# Patient Record
Sex: Female | Born: 1990 | Race: White | Hispanic: No | Smoking: Former smoker
Health system: Southern US, Community
[De-identification: ages and names within clinical notes are randomized; demographics above are authoritative.]

## PROBLEM LIST (undated history)

## (undated) ENCOUNTER — Inpatient Hospital Stay (HOSPITAL_COMMUNITY): Payer: Self-pay

## (undated) DIAGNOSIS — F32A Depression, unspecified: Secondary | ICD-10-CM

## (undated) DIAGNOSIS — F1920 Other psychoactive substance dependence, uncomplicated: Secondary | ICD-10-CM

## (undated) DIAGNOSIS — J45909 Unspecified asthma, uncomplicated: Secondary | ICD-10-CM

## (undated) DIAGNOSIS — K219 Gastro-esophageal reflux disease without esophagitis: Secondary | ICD-10-CM

## (undated) DIAGNOSIS — F329 Major depressive disorder, single episode, unspecified: Secondary | ICD-10-CM

## (undated) DIAGNOSIS — F419 Anxiety disorder, unspecified: Secondary | ICD-10-CM

## (undated) HISTORY — PX: OTHER SURGICAL HISTORY: SHX169

## (undated) HISTORY — PX: WISDOM TOOTH EXTRACTION: SHX21

---
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
## 2014-02-05 ENCOUNTER — Encounter (HOSPITAL_COMMUNITY): Payer: Self-pay

## 2014-02-05 ENCOUNTER — Emergency Department (HOSPITAL_COMMUNITY)
Admission: EM | Admit: 2014-02-05 | Discharge: 2014-02-05 | Disposition: A | Discharge status: Home / Self Care | Payer: 59 | Attending: Emergency Medicine | Admitting: Emergency Medicine

## 2014-02-05 DIAGNOSIS — F419 Anxiety disorder, unspecified: Secondary | ICD-10-CM | POA: Insufficient documentation

## 2014-02-05 DIAGNOSIS — R000 Tachycardia, unspecified: Secondary | ICD-10-CM | POA: Insufficient documentation

## 2014-02-05 DIAGNOSIS — F1410 Cocaine abuse, uncomplicated: Secondary | ICD-10-CM | POA: Insufficient documentation

## 2014-02-05 DIAGNOSIS — J45901 Unspecified asthma with (acute) exacerbation: Secondary | ICD-10-CM | POA: Diagnosis not present

## 2014-02-05 HISTORY — DX: Unspecified asthma, uncomplicated: J45.909

## 2014-02-05 LAB — CBC WITH DIFFERENTIAL/PLATELET
BASOS ABS: 0 10*3/uL (ref 0.0–0.1)
BASOS PCT: 0 % (ref 0–1)
EOS PCT: 1 % (ref 0–5)
Eosinophils Absolute: 0.1 10*3/uL (ref 0.0–0.7)
HEMATOCRIT: 39.2 % (ref 36.0–46.0)
HEMOGLOBIN: 12.9 g/dL (ref 12.0–15.0)
LYMPHS PCT: 40 % (ref 12–46)
Lymphs Abs: 3.6 10*3/uL (ref 0.7–4.0)
MCH: 26.6 pg (ref 26.0–34.0)
MCHC: 32.9 g/dL (ref 30.0–36.0)
MCV: 80.8 fL (ref 78.0–100.0)
MONOS PCT: 12 % (ref 3–12)
Monocytes Absolute: 1.1 10*3/uL — ABNORMAL HIGH (ref 0.1–1.0)
NEUTROS ABS: 4.1 10*3/uL (ref 1.7–7.7)
Neutrophils Relative %: 47 % (ref 43–77)
Platelets: 321 10*3/uL (ref 150–400)
RBC: 4.85 MIL/uL (ref 3.87–5.11)
RDW: 13.9 % (ref 11.5–15.5)
WBC: 8.9 10*3/uL (ref 4.0–10.5)

## 2014-02-05 LAB — BASIC METABOLIC PANEL
Anion gap: 20 — ABNORMAL HIGH (ref 5–15)
BUN: 11 mg/dL (ref 6–23)
CHLORIDE: 100 meq/L (ref 96–112)
CO2: 20 meq/L (ref 19–32)
Calcium: 9.4 mg/dL (ref 8.4–10.5)
Creatinine, Ser: 0.81 mg/dL (ref 0.50–1.10)
GFR calc non Af Amer: >90 mL/min (ref >90)
Glucose, Bld: 102 mg/dL — ABNORMAL HIGH (ref 70–99)
POTASSIUM: 3.1 meq/L — AB (ref 3.7–5.3)
Sodium: 140 mEq/L (ref 137–147)

## 2014-02-05 LAB — ETHANOL: Alcohol, Ethyl (B): <11 mg/dL (ref 0–11)

## 2014-02-05 LAB — I-STAT BETA HCG BLOOD, ED (MC, WL, AP ONLY)

## 2014-02-05 MED ORDER — POTASSIUM CHLORIDE CRYS ER 20 MEQ PO TBCR
40.0000 meq | EXTENDED_RELEASE_TABLET | Freq: Once | ORAL | Status: DC
  Filled 2014-02-05: qty 2

## 2014-02-05 MED ORDER — LORAZEPAM 2 MG/ML IJ SOLN
1.0000 mg | Freq: Once | INTRAMUSCULAR | Status: DC
  Filled 2014-02-05: qty 1

## 2014-02-05 MED ORDER — LORAZEPAM 2 MG/ML IJ SOLN
2.0000 mg | Freq: Once | INTRAMUSCULAR | Status: AC
  Administered 2014-02-05: 2 mg via INTRAVENOUS

## 2014-02-05 MED ORDER — LORAZEPAM 2 MG/ML IJ SOLN
INTRAMUSCULAR | Status: AC
  Filled 2014-02-05: qty 1

## 2014-02-05 NOTE — ED Notes (Signed)
Pt from home.  Sts she overdosed on cocaine IV.  Reports having trouble breathing shortly after doing drugs.  Pt tachycardic at 150 on arrival, O2 sats 100%.  Pt unsure of how much cocaine she took.

## 2014-02-05 NOTE — Discharge Instructions (Signed)
Stimulant Use Disorder-Cocaine °Cocaine is one of a group of powerful drugs called stimulants. Cocaine has medical uses for stopping nosebleeds and for pain control before minor nose or dental surgery. However, cocaine is misused because of the effects that it produces. These effects include:  °· A feeling of extreme pleasure. °· Alertness. °· High energy. °Common street names for cocaine include coke, crack, blow, snow, and nose candy. Cocaine is snorted, dissolved in water and injected, or smoked.  °Stimulants are addictive because they activate regions of the brain that produce both the pleasurable sensation of "reward" and psychological dependence. Together, these actions account for loss of control and the rapid development of drug dependence. This means you become ill without the drug (withdrawal) and need to keep using it to function.  °Stimulant use disorder is use of stimulants that disrupts your daily life. It disrupts relationships with family and friends and how you do your job. Cocaine increases your blood pressure and heart rate. It can cause a heart attack or stroke. Cocaine can also cause death from irregular heart rate or seizures. °SYMPTOMS °Symptoms of stimulant use disorder with cocaine include: °· Use of cocaine in larger amounts or over a longer period of time than intended. °· Unsuccessful attempts to cut down or control cocaine use. °· A lot of time spent obtaining, using, or recovering from the effects of cocaine. °· A strong desire or urge to use cocaine (craving). °· Continued use of cocaine in spite of major problems at work, school, or home because of use. °· Continued use of cocaine in spite of relationship problems because of use. °· Giving up or cutting down on important life activities because of cocaine use. °· Use of cocaine over and over in situations when it is physically hazardous, such as driving a car. °· Continued use of cocaine in spite of a physical problem that is likely  related to use. Physical problems can include: °¨ Malnutrition. °¨ Nosebleeds. °¨ Chest pain. °¨ High blood pressure. °¨ A hole that develops between the part of your nose that separates your nostrils (perforated nasal septum). °¨ Lung and kidney damage. °· Continued use of cocaine in spite of a mental problem that is likely related to use. Mental problems can include: °¨ Schizophrenia-like symptoms. °¨ Depression. °¨ Bipolar mood swings. °¨ Anxiety. °¨ Sleep problems. °· Need to use more and more cocaine to get the same effect, or lessened effect over time with use of the same amount of cocaine (tolerance). °· Having withdrawal symptoms when cocaine use is stopped, or using cocaine to reduce or avoid withdrawal symptoms. Withdrawal symptoms include: °¨ Depressed or irritable mood. °¨ Low energy or restlessness. °¨ Bad dreams. °¨ Poor or excessive sleep. °¨ Increased appetite. °DIAGNOSIS °Stimulant use disorder is diagnosed by your health care provider. You may be asked questions about your cocaine use and how it affects your life. A physical exam may be done. A drug screen may be ordered. You may be referred to a mental health professional. The diagnosis of stimulant use disorder requires at least two symptoms within 12 months. The type of stimulant use disorder depends on the number of signs and symptoms you have. The type may be: °· Mild. Two or three signs and symptoms. °· Moderate. Four or five signs and symptoms. °· Severe. Six or more signs and symptoms. °TREATMENT °Treatment for stimulant use disorder is usually provided by mental health professionals with training in substance use disorders. The following options are available: °·   Counseling or talk therapy. Talk therapy addresses the reasons you use cocaine and ways to keep you from using again. Goals of talk therapy include: °¨ Identifying and avoiding triggers for use. °¨ Handling cravings. °¨ Replacing use with healthy activities. °· Support groups.  Support groups provide emotional support, advice, and guidance. °· Medicine. Certain medicines may decrease cocaine cravings or withdrawal symptoms. °HOME CARE INSTRUCTIONS °· Take medicines only as directed by your health care provider. °· Identify the people and activities that trigger your cocaine use and avoid them. °· Keep all follow-up visits as directed by your health care provider. °SEEK MEDICAL CARE IF: °· Your symptoms get worse or you relapse. °· You are not able to take medicines as directed. °SEEK IMMEDIATE MEDICAL CARE IF: °· You have serious thoughts about hurting yourself or others. °· You have a seizure, chest pain, sudden weakness, or loss of speech or vision. °FOR MORE INFORMATION °· National Institute on Drug Abuse: www.drugabuse.gov °· Substance Abuse and Mental Health Services Administration: www.samhsa.gov °Document Released: 02/19/2000 Document Revised: 07/08/2013 Document Reviewed: 03/06/2013 °ExitCare® Patient Information ©2015 [employer]. This information is not intended to replace advice given to you by your health care provider. Make sure you discuss any questions you have with your health care provider. ° °

## 2014-02-05 NOTE — ED Provider Notes (Signed)
CSN: [id number]     Arrival date & time 02/05/14  0304 History  This chart was scribed for Toy BakerAnthony T Rhythm Gubbels, MD by Karle PlumberJennifer Tensley, ED Scribe. [facility] and the patient's care was started at 3:09 AM.  Chief Complaint  Patient presents with  . Drug Overdose   Patient is a 23 y.o. female presenting with Overdose. The history is provided by the patient. No language interpreter was used.  Drug Overdose Associated symptoms include shortness of breath.    HPI Comments:  Leah Whitaker is a 23 y.o. female with PMH of asthma who presents to the Emergency Department complaining of SOB secondary to using intravenous cocaine in her left arm PTA. She reports using approximately 1/4 gram and taking a Klonopin. She states she was trying to finish a paper for school and thought this would help her focus. She reports that she has done cocaine before from the same supplier in the past. Denies SI. Symptoms persistent and nothing makes them better. No prior history of same. Denies any other ingestions at this time.  Past Medical History  Diagnosis Date  . Asthma    History reviewed. No pertinent past surgical history. History reviewed. No pertinent family history. History  Substance Use Topics  . Smoking status: Unknown If Ever Smoked  . Smokeless tobacco: Not on file  . Alcohol Use: No   OB History    No data available     Review of Systems  Respiratory: Positive for shortness of breath.   Psychiatric/Behavioral: Negative for suicidal ideas. The patient is nervous/anxious.   All other systems reviewed and are negative.   Allergies  Review of patient's allergies indicates not on file.  Home Medications   Prior to Admission medications   Not on File   Triage Vitals: BP 136/90 mmHg  Pulse 147  Temp(Src) 98 F (36.7 C) (Oral)  Resp 14  Ht 5\' 8"  (1.727 m)  Wt 140 lb (63.504 kg)  BMI 21.29 kg/m2  SpO2 100% Physical Exam  Constitutional: She is oriented  to person, place, and time. She appears well-developed and well-nourished.  Non-toxic appearance. No distress.  HENT:  Head: Normocephalic and atraumatic.  Eyes: Conjunctivae, EOM and lids are normal. Pupils are equal, round, and reactive to light.  Neck: Normal range of motion. Neck supple. No tracheal deviation present. No thyroid mass present.  Cardiovascular: Regular rhythm and normal heart sounds.  Tachycardia present.  Exam reveals no gallop.   No murmur heard. Pulmonary/Chest: Effort normal and breath sounds normal. No stridor. No respiratory distress. She has no decreased breath sounds. She has no wheezes. She has no rhonchi. She has no rales.  Abdominal: Soft. Normal appearance and bowel sounds are normal. She exhibits no distension. There is no tenderness. There is no rebound and no CVA tenderness.  Musculoskeletal: Normal range of motion. She exhibits no edema or tenderness.  Neurological: She is alert and oriented to person, place, and time. She has normal strength. No cranial nerve deficit or sensory deficit. GCS eye subscore is 4. GCS verbal subscore is 5. GCS motor subscore is 6.  Skin: Skin is warm and dry. No abrasion and no rash noted.  Psychiatric: Her mood appears anxious. Her speech is rapid and/or pressured. She is agitated. She expresses no suicidal ideation. She expresses no suicidal plans.  Nursing note and vitals reviewed.   ED Course  Procedures (including critical care time) DIAGNOSTIC STUDIES: Oxygen Saturation is 100% on RA, normal  by my interpretation.   COORDINATION OF CARE: 3:12 AM- Will order blood work. Pt verbalizes understanding and agrees to plan.  Medications  LORazepam (ATIVAN) 2 MG/ML injection (not administered)    Labs Review Labs Reviewed  CBC WITH DIFFERENTIAL  BASIC METABOLIC PANEL    Imaging Review No results found.   EKG Interpretation   Date/Time:  Wednesday February 05 2014 03:06:22 EST Ventricular Rate:  150 PR Interval:   131 QRS Duration: 78 QT Interval:  363 QTC Calculation: 573 R Axis:   96 Text Interpretation:  Age not entered, assumed to be  23 years old for  purpose of ECG interpretation Sinus or ectopic atrial tachycardia  Borderline right axis deviation Minimal ST depression, diffuse leads  Prolonged QT interval Confirmed by Freida BusmanALLEN  MD, Averee Harb ([id number]) on 02/05/2014  3:15:05 AM      MDM   Final diagnoses:  None      I personally performed the services described in this documentation, which was scribed in my presence. The recorded information has been reviewed and is accurate.  4:49 AM Patient's heart rate rechecked and she is now resting comfortably with a heart rate of 108. She was given Ativan twice for her anxiety due to the cocaine use. She was again does not have any suicidal homicidal ideations. Patient states that she used to medications help her stay for test. She is stable for discharge  CRITICAL CARE Performed by: Toy BakerALLEN,Adrien Dietzman T Total critical care time: 50 Critical care time was exclusive of separately billable procedures and treating other patients. Critical care was necessary to treat or prevent imminent or life-threatening deterioration. Critical care was time spent personally by me on the following activities: development of treatment plan with patient and/or surrogate as well as nursing, discussions with consultants, evaluation of patient's response to treatment, examination of patient, obtaining history from patient or surrogate, ordering and performing treatments and interventions, ordering and review of laboratory studies, ordering and review of radiographic studies, pulse oximetry and re-evaluation of patient's condition.   Toy BakerAnthony T Oneita Allmon, MD 02/05/14 [phone]

## 2014-06-24 LAB — OB RESULTS CONSOLE HEPATITIS B SURFACE ANTIGEN: HEP B S AG: NEGATIVE

## 2014-06-24 LAB — OB RESULTS CONSOLE RPR: RPR: NONREACTIVE

## 2014-06-24 LAB — OB RESULTS CONSOLE ABO/RH: RH Type: POSITIVE

## 2014-06-24 LAB — OB RESULTS CONSOLE RUBELLA ANTIBODY, IGM: RUBELLA: IMMUNE

## 2014-06-24 LAB — OB RESULTS CONSOLE HIV ANTIBODY (ROUTINE TESTING): HIV: NONREACTIVE

## 2014-06-24 LAB — OB RESULTS CONSOLE ANTIBODY SCREEN: ANTIBODY SCREEN: NEGATIVE

## 2014-07-09 LAB — OB RESULTS CONSOLE GC/CHLAMYDIA
Chlamydia: NEGATIVE
GC PROBE AMP, GENITAL: NEGATIVE

## 2014-10-15 ENCOUNTER — Encounter (HOSPITAL_COMMUNITY): Payer: Self-pay | Admitting: *Deleted

## 2014-10-15 ENCOUNTER — Inpatient Hospital Stay (HOSPITAL_COMMUNITY)
Admission: AD | Admit: 2014-10-15 | Discharge: 2014-10-15 | Disposition: A | Discharge status: Home / Self Care | Payer: 59 | Source: Ambulatory Visit | Attending: Obstetrics and Gynecology | Admitting: Obstetrics and Gynecology

## 2014-10-15 DIAGNOSIS — Z3A32 32 weeks gestation of pregnancy: Secondary | ICD-10-CM | POA: Insufficient documentation

## 2014-10-15 DIAGNOSIS — Z87891 Personal history of nicotine dependence: Secondary | ICD-10-CM | POA: Insufficient documentation

## 2014-10-15 LAB — URINALYSIS, ROUTINE W REFLEX MICROSCOPIC
Bilirubin Urine: NEGATIVE
Glucose, UA: NEGATIVE mg/dL
Hgb urine dipstick: NEGATIVE
Ketones, ur: NEGATIVE mg/dL
Leukocytes, UA: NEGATIVE
NITRITE: NEGATIVE
PROTEIN: NEGATIVE mg/dL
UROBILINOGEN UA: 0.2 mg/dL (ref 0.0–1.0)
pH: 5.5 (ref 5.0–8.0)

## 2014-10-15 MED ORDER — NIFEDIPINE 10 MG PO CAPS: 10.0000 mg | ORAL_CAPSULE | Freq: Three times a day (TID) | ORAL | Status: DC

## 2014-10-15 MED ORDER — NIFEDIPINE 10 MG PO CAPS
10.0000 mg | ORAL_CAPSULE | Freq: Once | ORAL | Status: AC
  Administered 2014-10-15: 10 mg via ORAL
  Filled 2014-10-15: qty 1

## 2014-10-15 MED ORDER — BETAMETHASONE SOD PHOS & ACET 6 (3-3) MG/ML IJ SUSP
12.0000 mg | Freq: Once | INTRAMUSCULAR | Status: AC
  Administered 2014-10-15: 12 mg via INTRAMUSCULAR
  Filled 2014-10-15: qty 2

## 2014-10-15 NOTE — MAU Note (Signed)
Given a second pitcher of water and enc'd to drink.

## 2014-10-15 NOTE — Discharge Instructions (Signed)
Preterm Labor Information °Preterm labor is when labor starts at less than 37 weeks of pregnancy. The normal length of a pregnancy is 39 to 41 weeks. °CAUSES °Often, there is no identifiable underlying cause as to why a woman goes into preterm labor. One of the most common known causes of preterm labor is infection. Infections of the uterus, cervix, vagina, amniotic sac, bladder, kidney, or even the lungs (pneumonia) can cause labor to start. Other suspected causes of preterm labor include:  °· Urogenital infections, such as yeast infections and bacterial vaginosis.   °· Uterine abnormalities (uterine shape, uterine septum, fibroids, or bleeding from the placenta).   °· A cervix that has been operated on (it may fail to stay closed).   °· Malformations in the fetus.   °· Multiple gestations (twins, triplets, and so on).   °· Breakage of the amniotic sac.   °RISK FACTORS °· Having a previous history of preterm labor.   °· Having premature rupture of membranes (PROM).   °· Having a placenta that covers the opening of the cervix (placenta previa).   °· Having a placenta that separates from the uterus (placental abruption).   °· Having a cervix that is too weak to hold the fetus in the uterus (incompetent cervix).   °· Having too much fluid in the amniotic sac (polyhydramnios).   °· Taking illegal drugs or smoking while pregnant.   °· Not gaining enough weight while pregnant.   °· Being younger than 18 and older than 22 years old.   °· Having a low socioeconomic status.   °· Being African American. °SYMPTOMS °Signs and symptoms of preterm labor include:  °· Menstrual-like cramps, abdominal pain, or back pain. °· Uterine contractions that are regular, as frequent as six in an hour, regardless of their intensity (may be mild or painful). °· Contractions that start on the top of the uterus and spread down to the lower abdomen and back.   °· A sense of increased pelvic pressure.   °· A watery or bloody mucus discharge that  comes from the vagina.   °TREATMENT °Depending on the length of the pregnancy and other circumstances, your health care provider may suggest bed rest. If necessary, there are medicines that can be given to stop contractions and to mature the fetal lungs. If labor happens before 34 weeks of pregnancy, a prolonged hospital stay may be recommended. Treatment depends on the condition of both you and the fetus.  °WHAT SHOULD YOU DO IF YOU THINK YOU ARE IN PRETERM LABOR? °Call your health care provider right away. You will need to go to the hospital to get checked immediately. °HOW CAN YOU PREVENT PRETERM LABOR IN FUTURE PREGNANCIES? °You should:  °· Stop smoking if you smoke.  °· Maintain healthy weight gain and avoid chemicals and drugs that are not necessary. °· Be watchful for any type of infection. °· Inform your health care provider if you have a known history of preterm labor. °Document Released: 05/14/2003 Document Revised: 10/24/2012 Document Reviewed: 03/26/2012 °ExitCare® Patient Information ©2015 [employer]. This information is not intended to replace advice given to you by your health care provider. Make sure you discuss any questions you have with your health care provider. ° °Pelvic Rest °Pelvic rest is sometimes recommended for women when:  °· The placenta is partially or completely covering the opening of the cervix (placenta previa). °· There is bleeding between the uterine wall and the amniotic sac in the first trimester (subchorionic hemorrhage). °· The cervix begins to open without labor starting (incompetent cervix, cervical insufficiency). °· The labor is too early (preterm   labor). °HOME CARE INSTRUCTIONS °· Do not have sexual intercourse, stimulation, or an orgasm. °· Do not use tampons, douche, or put anything in the vagina. °· Do not lift anything over 10 pounds (4.5 kg). °· Avoid strenuous activity or straining your pelvic muscles. °SEEK MEDICAL CARE IF:  °· You have any vaginal bleeding  during pregnancy. Treat this as a potential emergency. °· You have cramping pain felt low in the stomach (stronger than menstrual cramps). °· You notice vaginal discharge (watery, mucus, or bloody). °· You have a low, dull backache. °· There are regular contractions or uterine tightening. °SEEK IMMEDIATE MEDICAL CARE IF: °You have vaginal bleeding and have placenta previa.  °Document Released: 06/18/2010 Document Revised: 05/16/2011 Document Reviewed: 06/18/2010 °ExitCare® Patient Information ©2015 [employer]. This information is not intended to replace advice given to you by your health care provider. Make sure you discuss any questions you have with your health care provider. ° °

## 2014-10-15 NOTE — MAU Note (Signed)
Pt sent from office for contractions. Is dilated to 2cm/50. C/o mild "periodlike" cramping.

## 2014-10-15 NOTE — MAU Note (Signed)
Given a pitcher of water and enc'd to drink. 

## 2014-10-15 NOTE — MAU Provider Note (Signed)
History     CSN: [id number]  Arrival date and time: 10/15/14 1552   First Provider Initiated Contact with Patient 10/15/14 1725      Chief Complaint  Patient presents with  . Contractions   HPI Comments: Leah Whitaker is a 24 y.o. G1P0 at [redacted]w[redacted]d who had routine office visit today. She's been feeling lower abdominal menstrual-like cramps for about 2 weeks intermittently. Denies feeling cramps or abdominal discomfort now. She has had an increase in amount of vaginal discharge for about 6 weeks. She describes it as thin white and non-irritative. Denies leaking of fluid. Had bloody discharge after vaginal exam today. Good fetal activity. She had office monitoring revealing 2-3 minute contractions and cervix 2/50 on vaginal exam. Nitrazine and fern tests were neg.  Prenatal course essentially uncomplicated.    GYN hx: Denies STIs, abnl Pap   Past Medical History  Diagnosis Date  . Asthma     Past Surgical History  Procedure Laterality Date  . Wisdom tooth extraction      History reviewed. No pertinent family history.  Social History  Substance Use Topics  . Smoking status: Former Games developer  . Smokeless tobacco: None  . Alcohol Use: No    Allergies: No Known Allergies  Prescriptions prior to admission  Medication Sig Dispense Refill Last Dose  . acetaminophen (TYLENOL) 500 MG tablet Take 500-1,000 mg by mouth every 6 (six) hours as needed for moderate pain or headache.   Past Week at Unknown time  . albuterol (PROVENTIL HFA;VENTOLIN HFA) 108 (90 BASE) MCG/ACT inhaler Inhale 2 puffs into the lungs every 4 (four) hours as needed for wheezing or shortness of breath.    rescue  . beclomethasone (QVAR) 40 MCG/ACT inhaler Inhale 1 puff into the lungs 2 (two) times daily.   rescue  . Prenatal Vit-Fe Fumarate-FA (PRENATAL MULTIVITAMIN) TABS tablet Take 1 tablet by mouth daily at 12 noon.   10/14/2014 at Unknown time  . ranitidine (ZANTAC) 75 MG tablet Take 75 mg by mouth daily as  needed for heartburn.   10/14/2014 at Unknown time    Review of Systems  Constitutional: Negative for fever.  Gastrointestinal: Positive for heartburn. Negative for nausea, abdominal pain, diarrhea and constipation.  Genitourinary: Negative for dysuria, urgency, frequency, hematuria and flank pain.  Neurological: Negative for dizziness and headaches.  Psychiatric/Behavioral: Negative for depression.   Physical Exam   Blood pressure 106/62, pulse 72, temperature 99.1 F (37.3 C), temperature source Oral, resp. rate 18, height 5\' 8"  (1.727 m), weight 69.219 kg (152 lb 9.6 oz).  Physical Exam  Constitutional: She is oriented to person, place, and time. She appears well-developed and well-nourished. No distress.  HENT:  Head: Normocephalic.  Eyes: Conjunctivae are normal. Pupils are equal, round, and reactive to light.  Neck: Normal range of motion. Neck supple.  Cardiovascular: Normal rate.   Respiratory: Effort normal.  GI: Soft. There is no tenderness.  S=D  Musculoskeletal: Normal range of motion.  Neurological: She is alert and oriented to person, place, and time.  Skin: Skin is warm and dry.  Psychiatric: She has a normal mood and affect. Her behavior is normal.   1830: SVE: posterior, soft 1.5/30/-2 cephalic  MAU Course  Procedures Results for orders placed or performed during the hospital encounter of 10/15/14 (from the past 24 hour(s))  Urinalysis, Routine w reflex microscopic (not at [facility])     Status: Abnormal   Collection Time: 10/15/14  4:15 PM  Result Value Ref Range  Color, Urine YELLOW YELLOW   APPearance CLEAR CLEAR   Specific Gravity, Urine <1.005 (L) 1.005 - 1.030   pH 5.5 5.0 - 8.0   Glucose, UA NEGATIVE NEGATIVE mg/dL   Hgb urine dipstick NEGATIVE NEGATIVE   Bilirubin Urine NEGATIVE NEGATIVE   Ketones, ur NEGATIVE NEGATIVE mg/dL   Protein, ur NEGATIVE NEGATIVE mg/dL   Urobilinogen, UA 0.2 0.0 - 1.0 mg/dL   Nitrite NEGATIVE NEGATIVE   Leukocytes, UA  NEGATIVE NEGATIVE   EFM: Baseline 120-125,reactive; low amplitude UCs q3-5 minutes 40-50 seconds (pt unaware) Procardia  po given at 1745  C/W Dr. Marcelle Overlie. Advised inpatient management but pt declines.  Betamethasone 12.mg IM given at 1900  Assessment and Plan  G1 at [redacted]w[redacted]d 1. Preterm labor in third trimester without delivery    Discharge home on Procardia, pelvic rest Work excuse for 2 days   Medication List    TAKE these medications        acetaminophen 500 MG tablet  Commonly known as:  TYLENOL  Take 500-1,000 mg by mouth every 6 (six) hours as needed for moderate pain or headache.     albuterol 108 (90 BASE) MCG/ACT inhaler  Commonly known as:  PROVENTIL HFA;VENTOLIN HFA  Inhale 2 puffs into the lungs every 4 (four) hours as needed for wheezing or shortness of breath.     beclomethasone 40 MCG/ACT inhaler  Commonly known as:  QVAR  Inhale 1 puff into the lungs 2 (two) times daily.     NIFEdipine 10 MG capsule  Commonly known as:  PROCARDIA  Take 1 capsule (10 mg total) by mouth every 8 (eight) hours.     prenatal multivitamin Tabs tablet  Take 1 tablet by mouth daily at 12 noon.     ranitidine 75 MG tablet  Commonly known as:  ZANTAC  Take 75 mg by mouth daily as needed for heartburn.        Follow-up Information    Follow up with Meriel Pica, MD. Schedule an appointment as soon as possible for a visit in 2 days.   Specialty:  Obstetrics and Gynecology   Contact information:   [address] [place] [address] [phone]       Follow up with [facility] [place] MATERNITY ADMISSIONS In 1 day.   Contact information:   [address] [id number] mc [place] [address] [phone]     Eden Toohey 10/15/2014, 5:33 PM

## 2014-10-16 ENCOUNTER — Inpatient Hospital Stay (HOSPITAL_COMMUNITY)
Admission: AD | Admit: 2014-10-16 | Discharge: 2014-10-16 | Disposition: A | Discharge status: Home / Self Care | Payer: 59 | Source: Ambulatory Visit | Attending: Obstetrics and Gynecology | Admitting: Obstetrics and Gynecology

## 2014-10-16 DIAGNOSIS — Z3A32 32 weeks gestation of pregnancy: Secondary | ICD-10-CM | POA: Diagnosis not present

## 2014-10-16 MED ORDER — BETAMETHASONE SOD PHOS & ACET 6 (3-3) MG/ML IJ SUSP
12.0000 mg | Freq: Once | INTRAMUSCULAR | Status: AC
  Administered 2014-10-16: 12 mg via INTRAMUSCULAR
  Filled 2014-10-16: qty 2

## 2014-10-16 NOTE — MAU Note (Signed)
Pt states here for 2nd BMZ injection. Denies any changes with contractions. Has some discharge, but states that she has had it for a few weeks now and it is consistently the same as before. +FM

## 2014-11-07 LAB — OB RESULTS CONSOLE GBS: STREP GROUP B AG: NEGATIVE

## 2014-11-21 ENCOUNTER — Encounter (HOSPITAL_COMMUNITY): Payer: Self-pay | Admitting: *Deleted

## 2014-11-21 ENCOUNTER — Inpatient Hospital Stay (HOSPITAL_COMMUNITY)
Admission: AD | Admit: 2014-11-21 | Discharge: 2014-11-21 | Disposition: A | Discharge status: Home / Self Care | Payer: 59 | Source: Ambulatory Visit | Attending: Obstetrics and Gynecology | Admitting: Obstetrics and Gynecology

## 2014-11-21 DIAGNOSIS — Z3493 Encounter for supervision of normal pregnancy, unspecified, third trimester: Secondary | ICD-10-CM | POA: Insufficient documentation

## 2014-11-21 MED ORDER — ZOLPIDEM TARTRATE 5 MG PO TABS
5.0000 mg | ORAL_TABLET | Freq: Once | ORAL | Status: AC
  Administered 2014-11-21: 5 mg via ORAL
  Filled 2014-11-21: qty 1

## 2014-11-21 NOTE — MAU Note (Signed)
Irreg ctxs since Tues. Weds was 4cm. Closer today. Denies bleeding or LOF.

## 2014-11-21 NOTE — Progress Notes (Signed)
Note made in [facility]'s record

## 2014-11-21 NOTE — Progress Notes (Signed)
Strip monitored on incorrect patient starting at 2104.  Strip printed and sent to medical records

## 2014-11-28 ENCOUNTER — Inpatient Hospital Stay (HOSPITAL_COMMUNITY): Payer: 59 | Admitting: Anesthesiology

## 2014-11-28 ENCOUNTER — Encounter (HOSPITAL_COMMUNITY): Payer: Self-pay

## 2014-11-28 ENCOUNTER — Inpatient Hospital Stay (HOSPITAL_COMMUNITY)
Admission: AD | Admit: 2014-11-28 | Discharge: 2014-11-30 | DRG: 775 | Disposition: A | Discharge status: Home / Self Care | Payer: 59 | Source: Ambulatory Visit | Attending: Obstetrics and Gynecology | Admitting: Obstetrics and Gynecology

## 2014-11-28 DIAGNOSIS — J45909 Unspecified asthma, uncomplicated: Secondary | ICD-10-CM | POA: Diagnosis present

## 2014-11-28 DIAGNOSIS — O99324 Drug use complicating childbirth: Secondary | ICD-10-CM | POA: Diagnosis present

## 2014-11-28 DIAGNOSIS — O9952 Diseases of the respiratory system complicating childbirth: Secondary | ICD-10-CM | POA: Diagnosis present

## 2014-11-28 DIAGNOSIS — Z3A38 38 weeks gestation of pregnancy: Secondary | ICD-10-CM | POA: Diagnosis present

## 2014-11-28 DIAGNOSIS — Z87891 Personal history of nicotine dependence: Secondary | ICD-10-CM

## 2014-11-28 DIAGNOSIS — F1490 Cocaine use, unspecified, uncomplicated: Secondary | ICD-10-CM | POA: Diagnosis present

## 2014-11-28 LAB — CBC
HEMATOCRIT: 34.1 % — AB (ref 36.0–46.0)
HEMOGLOBIN: 11.3 g/dL — AB (ref 12.0–15.0)
MCH: 25.9 pg — ABNORMAL LOW (ref 26.0–34.0)
MCHC: 33.1 g/dL (ref 30.0–36.0)
MCV: 78 fL (ref 78.0–100.0)
Platelets: 198 10*3/uL (ref 150–400)
RBC: 4.37 MIL/uL (ref 3.87–5.11)
RDW: 14 % (ref 11.5–15.5)
WBC: 12.3 10*3/uL — AB (ref 4.0–10.5)

## 2014-11-28 LAB — RAPID URINE DRUG SCREEN, HOSP PERFORMED
Amphetamines: NOT DETECTED
Barbiturates: NOT DETECTED
Benzodiazepines: NOT DETECTED
Cocaine: NOT DETECTED
OPIATES: NOT DETECTED
Tetrahydrocannabinol: NOT DETECTED

## 2014-11-28 LAB — TYPE AND SCREEN
ABO/RH(D): A POS
ANTIBODY SCREEN: NEGATIVE

## 2014-11-28 LAB — ABO/RH: ABO/RH(D): A POS

## 2014-11-28 LAB — RPR: RPR: NONREACTIVE

## 2014-11-28 MED ORDER — LACTATED RINGERS IV SOLN
INTRAVENOUS | Status: DC
  Administered 2014-11-28: 125 mL/h via INTRAVENOUS

## 2014-11-28 MED ORDER — ALBUTEROL SULFATE (2.5 MG/3ML) 0.083% IN NEBU: 3.0000 mL | INHALATION_SOLUTION | RESPIRATORY_TRACT | Status: DC | PRN

## 2014-11-28 MED ORDER — OXYTOCIN BOLUS FROM INFUSION: 500.0000 mL | INTRAVENOUS | Status: DC

## 2014-11-28 MED ORDER — FLEET ENEMA 7-19 GM/118ML RE ENEM: 1.0000 | ENEMA | RECTAL | Status: DC | PRN

## 2014-11-28 MED ORDER — FENTANYL 2.5 MCG/ML BUPIVACAINE 1/10 % EPIDURAL INFUSION (WH - ANES)
14.0000 mL/h | INTRAMUSCULAR | Status: DC | PRN
  Administered 2014-11-28 (×2): 14 mL/h via EPIDURAL
  Filled 2014-11-28 (×2): qty 125

## 2014-11-28 MED ORDER — BUTORPHANOL TARTRATE 1 MG/ML IJ SOLN
2.0000 mg | Freq: Once | INTRAMUSCULAR | Status: AC
  Administered 2014-11-28: 2 mg via INTRAVENOUS
  Filled 2014-11-28: qty 2

## 2014-11-28 MED ORDER — SENNOSIDES-DOCUSATE SODIUM 8.6-50 MG PO TABS
2.0000 | ORAL_TABLET | ORAL | Status: DC
  Administered 2014-11-28: 2 via ORAL
  Filled 2014-11-28 (×2): qty 2

## 2014-11-28 MED ORDER — PRENATAL MULTIVITAMIN CH
1.0000 | ORAL_TABLET | Freq: Every day | ORAL | Status: DC
  Administered 2014-11-29 – 2014-11-30 (×2): 1 via ORAL
  Filled 2014-11-28 (×2): qty 1

## 2014-11-28 MED ORDER — LACTATED RINGERS IV SOLN: 500.0000 mL | INTRAVENOUS | Status: DC | PRN

## 2014-11-28 MED ORDER — ONDANSETRON HCL 4 MG/2ML IJ SOLN: 4.0000 mg | Freq: Four times a day (QID) | INTRAMUSCULAR | Status: DC | PRN

## 2014-11-28 MED ORDER — WITCH HAZEL-GLYCERIN EX PADS: 1.0000 "application " | MEDICATED_PAD | CUTANEOUS | Status: DC | PRN

## 2014-11-28 MED ORDER — IBUPROFEN 600 MG PO TABS
600.0000 mg | ORAL_TABLET | Freq: Four times a day (QID) | ORAL | Status: DC
  Administered 2014-11-28 – 2014-11-30 (×8): 600 mg via ORAL
  Filled 2014-11-28 (×8): qty 1

## 2014-11-28 MED ORDER — EPHEDRINE 5 MG/ML INJ
10.0000 mg | INTRAVENOUS | Status: DC | PRN
Start: 2014-11-28 — End: 2014-11-28
  Filled 2014-11-28: qty 2

## 2014-11-28 MED ORDER — PHENYLEPHRINE 40 MCG/ML (10ML) SYRINGE FOR IV PUSH (FOR BLOOD PRESSURE SUPPORT)
80.0000 ug | PREFILLED_SYRINGE | INTRAVENOUS | Status: DC | PRN
  Filled 2014-11-28: qty 20
  Filled 2014-11-28: qty 2

## 2014-11-28 MED ORDER — BENZOCAINE-MENTHOL 20-0.5 % EX AERO
1.0000 "application " | INHALATION_SPRAY | CUTANEOUS | Status: DC | PRN
  Filled 2014-11-28: qty 56

## 2014-11-28 MED ORDER — OXYCODONE-ACETAMINOPHEN 5-325 MG PO TABS: 2.0000 | ORAL_TABLET | ORAL | Status: DC | PRN

## 2014-11-28 MED ORDER — ACETAMINOPHEN 325 MG PO TABS: 650.0000 mg | ORAL_TABLET | ORAL | Status: DC | PRN

## 2014-11-28 MED ORDER — ONDANSETRON HCL 4 MG PO TABS: 4.0000 mg | ORAL_TABLET | ORAL | Status: DC | PRN

## 2014-11-28 MED ORDER — DIBUCAINE 1 % RE OINT: 1.0000 "application " | TOPICAL_OINTMENT | RECTAL | Status: DC | PRN

## 2014-11-28 MED ORDER — PROMETHAZINE HCL 25 MG/ML IJ SOLN
12.5000 mg | Freq: Once | INTRAMUSCULAR | Status: AC
  Administered 2014-11-28: 12.5 mg via INTRAVENOUS
  Filled 2014-11-28: qty 1

## 2014-11-28 MED ORDER — LANOLIN HYDROUS EX OINT: TOPICAL_OINTMENT | CUTANEOUS | Status: DC | PRN

## 2014-11-28 MED ORDER — ONDANSETRON HCL 4 MG/2ML IJ SOLN: 4.0000 mg | INTRAMUSCULAR | Status: DC | PRN

## 2014-11-28 MED ORDER — DIPHENHYDRAMINE HCL 50 MG/ML IJ SOLN: 12.5000 mg | INTRAMUSCULAR | Status: DC | PRN

## 2014-11-28 MED ORDER — LIDOCAINE HCL (PF) 1 % IJ SOLN
INTRAMUSCULAR | Status: DC | PRN
  Administered 2014-11-28: 8 mL via EPIDURAL
  Administered 2014-11-28: 7 mL via EPIDURAL

## 2014-11-28 MED ORDER — CITRIC ACID-SODIUM CITRATE 334-500 MG/5ML PO SOLN
30.0000 mL | ORAL | Status: DC | PRN
  Administered 2014-11-28: 30 mL via ORAL
  Filled 2014-11-28: qty 15

## 2014-11-28 MED ORDER — OXYCODONE-ACETAMINOPHEN 5-325 MG PO TABS
1.0000 | ORAL_TABLET | ORAL | Status: DC | PRN
  Administered 2014-11-28 – 2014-11-29 (×3): 1 via ORAL
  Filled 2014-11-28 (×3): qty 1

## 2014-11-28 MED ORDER — OXYCODONE-ACETAMINOPHEN 5-325 MG PO TABS
1.0000 | ORAL_TABLET | ORAL | Status: DC | PRN
  Administered 2014-11-28: 1 via ORAL
  Filled 2014-11-28: qty 1

## 2014-11-28 MED ORDER — OXYTOCIN 40 UNITS IN LACTATED RINGERS INFUSION - SIMPLE MED
62.5000 mL/h | INTRAVENOUS | Status: DC
  Administered 2014-11-28: 62.5 mL/h via INTRAVENOUS
  Filled 2014-11-28: qty 1000

## 2014-11-28 MED ORDER — SIMETHICONE 80 MG PO CHEW: 80.0000 mg | CHEWABLE_TABLET | ORAL | Status: DC | PRN

## 2014-11-28 MED ORDER — ZOLPIDEM TARTRATE 5 MG PO TABS: 5.0000 mg | ORAL_TABLET | Freq: Every evening | ORAL | Status: DC | PRN

## 2014-11-28 MED ORDER — TETANUS-DIPHTH-ACELL PERTUSSIS 5-2.5-18.5 LF-MCG/0.5 IM SUSP: 0.5000 mL | Freq: Once | INTRAMUSCULAR | Status: DC

## 2014-11-28 MED ORDER — LIDOCAINE HCL (PF) 1 % IJ SOLN
30.0000 mL | INTRAMUSCULAR | Status: DC | PRN
  Filled 2014-11-28: qty 30

## 2014-11-28 MED ORDER — ACETAMINOPHEN 325 MG PO TABS
650.0000 mg | ORAL_TABLET | ORAL | Status: DC | PRN
  Administered 2014-11-29 – 2014-11-30 (×5): 650 mg via ORAL
  Filled 2014-11-28 (×5): qty 2

## 2014-11-28 MED ORDER — DIPHENHYDRAMINE HCL 25 MG PO CAPS: 25.0000 mg | ORAL_CAPSULE | Freq: Four times a day (QID) | ORAL | Status: DC | PRN

## 2014-11-28 NOTE — MAU Note (Signed)
Pt states contractions every 2 mins. Denies LOF or vag bleeding. Was 4cm in office today.

## 2014-11-28 NOTE — Progress Notes (Signed)
Patient mood instability has been observed by this nurse in patient responses to people and events within minutes.  Patient stated to patient's father she was too upset and in too much pain to answer a question, but within one minute was textmessaging on phone smiling and talking to another person in the room.

## 2014-11-28 NOTE — Anesthesia Preprocedure Evaluation (Signed)
Anesthesia Evaluation  Patient identified by MRN, date of birth, ID band Patient awake    Reviewed: Allergy & Precautions, H&P , NPO status , Patient's Chart, lab work & pertinent test results  Airway Mallampati: I  TM Distance: >3 FB Neck ROM: full    Dental no notable dental hx.    Pulmonary former smoker,    Pulmonary exam normal        Cardiovascular negative cardio ROS Normal cardiovascular exam     Neuro/Psych negative neurological ROS  negative psych ROS   GI/Hepatic negative GI ROS, Neg liver ROS,   Endo/Other  negative endocrine ROS  Renal/GU negative Renal ROS     Musculoskeletal   Abdominal Normal abdominal exam  (+)   Peds  Hematology negative hematology ROS (+)   Anesthesia Other Findings   Reproductive/Obstetrics (+) Pregnancy                             Anesthesia Physical Anesthesia Plan  ASA: II  Anesthesia Plan: Epidural   Post-op Pain Management:    Induction:   Airway Management Planned:   Additional Equipment:   Intra-op Plan:   Post-operative Plan:   Informed Consent: I have reviewed the patients History and Physical, chart, labs and discussed the procedure including the risks, benefits and alternatives for the proposed anesthesia with the patient or authorized representative who has indicated his/her understanding and acceptance.     Plan Discussed with:   Anesthesia Plan Comments:         Anesthesia Quick Evaluation  

## 2014-11-28 NOTE — Lactation Note (Signed)
This note was copied from the chart of Leah Nadyne Gariepy. Lactation Consultation Note Initial visit at 11 hours of age.  Baby asleep in crib after bath.  Mom reports a good feeding about 1 hours ago.  [facility] LC resources given and discussed.  Encouraged to feed with early cues on demand.  Early newborn behavior discussed.  Hand expression demonstrated with colostrum visible mom is able to return demonstration.  Mom to call for assist as needed.    Patient Name: Leah Whitaker [id number] Date: 11/28/2014 Reason for consult: Initial assessment   Maternal Data Has patient been taught Hand Expression?: Yes Does the patient have breastfeeding experience prior to this delivery?: No  Feeding Feeding Type: Breast Fed Length of feed: 20 min  LATCH Score/Interventions Latch: Too sleepy or reluctant, no latch achieved, no sucking elicited.              Intervention(s): Breastfeeding basics reviewed     Lactation Tools Discussed/Used WIC Program: No   Consult Status Consult Status: Follow-up Date: 11/29/14 Follow-up type: In-patient    Leah Whitaker 11/28/2014, 10:41 PM

## 2014-11-28 NOTE — Anesthesia Procedure Notes (Signed)
Epidural Patient location during procedure: OB Start time: 11/28/2014 3:56 AM End time: 11/28/2014 4:00 AM  Staffing Anesthesiologist: Leilani Able Performed by: anesthesiologist   Preanesthetic Checklist Completed: patient identified, surgical consent, pre-op evaluation, timeout performed, IV checked, risks and benefits discussed and monitors and equipment checked  Epidural Patient position: sitting Prep: site prepped and draped and DuraPrep Patient monitoring: continuous pulse ox and blood pressure Approach: midline Location: L3-L4 Injection technique: LOR air  Needle:  Needle type: Tuohy  Needle gauge: 17 G Needle length: 9 cm and 9 Needle insertion depth: 5 cm cm Catheter type: closed end flexible Catheter size: 19 Gauge Catheter at skin depth: 10 cm Test dose: negative and Other  Assessment Sensory level: T9 Events: blood not aspirated, injection not painful, no injection resistance, negative IV test and no paresthesia  Additional Notes Reason for block:procedure for pain

## 2014-11-28 NOTE — H&P (Signed)
Leah Whitaker is a 24 y.o. female presenting for C/O UCs. Denies ROM , denies H/A and vision change. Maternal Medical History:  Reason for admission: Contractions.   Contractions: Onset was 3-5 hours ago.    Fetal activity: Perceived fetal activity is normal.      OB History    Gravida Para Term Preterm AB TAB SAB Ectopic Multiple Living   1              Past Medical History  Diagnosis Date  . Asthma     last inhaler use "years ago"   Past Surgical History  Procedure Laterality Date  . Wisdom tooth extraction     Family History: family history is not on file. Social History:  reports that she has quit smoking. She does not have any smokeless tobacco history on file. 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 She reports that she does not drink alcohol.   Prenatal Transfer Tool  Maternal Diabetes: No Genetic Screening: Normal Maternal Ultrasounds/Referrals: Normal Fetal Ultrasounds or other Referrals:  None Maternal Substance Abuse:  Yes:  Type: Cocaine history of cocaine abuse. Patient denies any substance abuse during pregnancy. Significant Maternal Medications:  None Significant Maternal Lab Results:  None Other Comments:  None  Review of Systems  Eyes: Negative for blurred vision.  Gastrointestinal: Negative for abdominal pain.  Neurological: Negative for headaches.    Dilation: 6 Effacement (%): 100 Station: 0 Exam by:: tomblin Blood pressure 108/69, pulse 83, temperature 98.7 F (37.1 C), temperature source Oral, resp. rate 16, height 5' 5.5" (1.664 m), weight 162 lb 3.2 oz (73.573 kg). Maternal Exam:  Uterine Assessment: Contraction strength is firm.  Contraction frequency is regular.   Abdomen: Patient reports no abdominal tenderness. Fetal presentation: vertex     Fetal Exam Fetal State Assessment: Category I - tracings are normal.     Physical Exam  Cardiovascular: Normal rate and regular rhythm.   Respiratory: Effort normal  and breath sounds normal.  GI: Soft. There is no tenderness.  Neurological: She has normal reflexes.   AROM clear   Prenatal labs: ABO, Rh: --/--/A POS, A POS (09/23 0220) Antibody: NEG (09/23 0220) Rubella: Immune (04/19 0000) RPR: Nonreactive (04/19 0000)  HBsAg: Negative (04/19 0000)  HIV: Non-reactive (04/19 0000)  GBS: Negative (09/02 0000)   Assessment/Plan: 24 yo G1P0 @ 38 3/7 weeks in active labor Urine drug screen   TOMBLIN II,JAMES E 11/28/2014, 8:04 AM

## 2014-11-28 NOTE — MAU Note (Signed)
Pt screaming in room and at staff because she says that she "cannot take this pain anymore and that RN better call MD or she will call MD herself" RN updated Dr Vincente Poli about patient and patient condition. RN to admit patient to L&D per Dr Vincente Poli and give pain IV pain medication in triage.

## 2014-11-28 NOTE — Progress Notes (Signed)
Delivery Note At 10:52 AM a viable female was delivered via Vaginal, Spontaneous Delivery (Presentation: Left Occiput Anterior).  APGAR: 9, 9; weight  .   Placenta status: Intact, Spontaneous.  Cord: 3 vessels with the following complications: None.  Cord pH: not done  Anesthesia: Epidural  Episiotomy: None Lacerations: 2nd degree;Sulcus right Suture Repair: vicryl rapide Est. Blood Loss (mL):    Mom to postpartum.  Baby to Couplet care / Skin to Skin.  TOMBLIN II,JAMES E 11/28/2014, 11:18 AM

## 2014-11-28 NOTE — Anesthesia Postprocedure Evaluation (Signed)
  Anesthesia Post-op Note  Patient: Leah Whitaker  Procedure(s) Performed: * No procedures listed *  Patient Location: Mother/Baby  Anesthesia Type:Epidural  Level of Consciousness: awake, alert  and oriented  Airway and Oxygen Therapy: Patient Spontanous Breathing  Post-op Pain: none  Post-op Assessment: Post-op Vital signs reviewed and Patient's Cardiovascular Status Stable              Post-op Vital Signs: Reviewed and stable  Last Vitals:  Filed Vitals:   11/28/14 1500  BP: 105/63  Pulse: 75  Temp:   Resp: 20    Complications: No apparent anesthesia complications

## 2014-11-29 LAB — CBC
HCT: 28 % — ABNORMAL LOW (ref 36.0–46.0)
HEMOGLOBIN: 9.1 g/dL — AB (ref 12.0–15.0)
MCH: 25.8 pg — AB (ref 26.0–34.0)
MCHC: 32.5 g/dL (ref 30.0–36.0)
MCV: 79.3 fL (ref 78.0–100.0)
Platelets: 170 10*3/uL (ref 150–400)
RBC: 3.53 MIL/uL — AB (ref 3.87–5.11)
RDW: 14.5 % (ref 11.5–15.5)
WBC: 11.4 10*3/uL — AB (ref 4.0–10.5)

## 2014-11-29 NOTE — Lactation Note (Signed)
This note was copied from the chart of Leah Whitaker. Lactation Consultation Note  Follow Assessment with 40 hour old Leah Whitaker. Mom reports that infant has been eating a lot today. Discussed BF basics and normalcy of cluster feeds. Mom reports her prefers left side over right side. Right side is fuller today per mom and looks fuller on exam. Infant was very fussy and difficult to console. Had him suck on gloved finger to calm him to get him to latch. Infant did latch to right side in cross cradle hold with vigorous sucking and intermittent audible swallows. Enc mom to pull infant on for deeper latch and to compress/massage breast during feeding. Enc 8-12 fees in 24 hours with first feeding cues. Infant with 10 + BF, 4 voids and 2 stools in last 24 hours. Enc mom to call PRN questions/concerns.   Patient Name: Leah Whitaker [id number] Date: 11/29/2014 Reason for consult: Follow-up assessment   Maternal Data    Feeding Feeding Type: Breast Fed  LATCH Score/Interventions Latch: Repeated attempts needed to sustain latch, nipple held in mouth throughout feeding, stimulation needed to elicit sucking reflex. Intervention(s): Skin to skin;Teach feeding cues;Waking techniques Intervention(s): Assist with latch;Adjust position  Audible Swallowing: Spontaneous and intermittent Intervention(s): Skin to skin  Type of Nipple: Everted at rest and after stimulation  Comfort (Breast/Nipple): Filling, red/small blisters or bruises, mild/mod discomfort  Problem noted: Filling  Hold (Positioning): Assistance needed to correctly position infant at breast and maintain latch. Intervention(s): Breastfeeding basics reviewed;Support Pillows;Position options;Skin to skin  LATCH Score: 7  Lactation Tools Discussed/Used     Consult Status Consult Status: Follow-up Date: 11/30/14 Follow-up type: In-patient    Silas Flood Hice 11/29/2014, 11:34 PM

## 2014-11-29 NOTE — Progress Notes (Signed)
Post Partum Day 1 Subjective: up ad lib, voiding and tolerating PO  Objective: Blood pressure 101/62, pulse 67, temperature 98 F (36.7 C), temperature source Oral, resp. rate 19, height 5' 5.5" (1.664 m), weight 162 lb 3.2 oz (73.573 kg), SpO2 99 %.  Physical Exam:  General: alert, cooperative and no distress Lochia: appropriate Uterine Fundus: firm Incision: healing well DVT Evaluation: No evidence of DVT seen on physical exam.   Recent Labs  11/28/14 0220 11/29/14 0545  HGB 11.3* 9.1*  HCT 34.1* 28.0*    Assessment/Plan: Plan for discharge tomorrow and Social Work consult   LOS: 1 day   TOMBLIN II,JAMES E 11/29/2014, 9:43 AM

## 2014-11-30 ENCOUNTER — Encounter (HOSPITAL_COMMUNITY): Payer: Self-pay | Admitting: *Deleted

## 2014-11-30 NOTE — Lactation Note (Addendum)
This note was copied from the chart of Leah Purity Irmen. Lactation Consultation Note  7.3% weight loss. Baby sucking pacifier upon entering. Reviewed that pacifier use not recommended at this time.  Mother states baby has been hungry through the night and RN said she should start supplementing after breastfeeding w/ formula.  Provided education regarding cluster feeding.  Additionally reviewed  the importance of placing baby STS after 3-3.5 hours of not feeding.  Baby has also had some long periods of sleeping. Burped baby and placed him STS on mother's chest. Mother attempted latching in cradle hold w/ shallow latch.  Showed her how to place baby in football hold and cross cradle for more depth. Mother needs reminding about positioning. Mother seems to prefer cradle. Attempted breastfeeding but baby does not seem hungry.  Mother states baby breastfed and gave baby formula at 715 am. Nipples tender.  Has comfort gels. Discussed supply and demand.  Suggest mother set an alarm and place baby STS and not allow baby to sleep for long periods w/o feeding. Reviewed engorgement care and monitoring voids/stools. Set up OP 9/29 2:30p    Patient Name: Leah Whitaker [id number] Date: 11/30/2014     Maternal Data    Feeding Feeding Type: Breast Fed Length of feed: 10 min  LATCH Score/Interventions                      Lactation Tools Discussed/Used     Consult Status      Leah Whitaker 11/30/2014, 9:19 AM

## 2014-11-30 NOTE — Discharge Summary (Signed)
Obstetric Discharge Summary Reason for Admission: onset of labor Prenatal Procedures: none Intrapartum Procedures: spontaneous vaginal delivery Postpartum Procedures: none Complications-Operative and Postpartum: none HEMOGLOBIN  Date Value Ref Range Status  11/29/2014 9.1* 12.0 - 15.0 g/dL Final   HCT  Date Value Ref Range Status  11/29/2014 28.0* 36.0 - 46.0 % Final    Physical Exam:  General: alert, cooperative and no distress Lochia: appropriate Uterine Fundus: firm Incision: healing well DVT Evaluation: No evidence of DVT seen on physical exam.  Discharge Diagnoses: Term Pregnancy-delivered  Discharge Information: Date: 11/30/2014 Activity: pelvic rest Diet: routine Medications: PNV and Ibuprofen Condition: stable Instructions: refer to practice specific booklet Discharge to: home   Newborn Data: Live born female  Birth Weight: 7 lb 0.3 oz (3184 g) APGAR: 9, 9  Home with mother.  TOMBLIN II,JAMES E 11/30/2014, 9:29 PM

## 2014-11-30 NOTE — Clinical Social Work Maternal (Signed)
  CLINICAL SOCIAL WORK MATERNAL/CHILD NOTE  Patient Details  Name: Leah Whitaker MRN: [id number] Date of Birth: 1990-04-12  Date:  11/30/2014  Clinical Social Worker Initiating Note:  Norlene Duel, LCSW Date/ Time Initiated:  11/30/14/1000     Child's Name:  [name]   Legal Guardian:   Geoffery Lyons and Reche Dixon)   Need for Interpreter:  None   Date of Referral:  11/29/14     Reason for Referral:  Other (Comment)   Referral Source:  [facility]   Address:  [address]  [place], [place] [address]  Phone number:   [phone])   Household Members:      Natural Supports (not living in the home):  Spouse/significant other   Professional Supports: None   Employment:  (Spouse is employed)   Type of Work:     Education:  Attending college (Mother states that she is currently at [employer] completing a degree in Early Childhood Ed                       )   Museum/gallery curator Resources:  Multimedia programmer   Other Resources:      Cultural/Religious Considerations Which May Impact Care:  none noted  Strengths:  Ability to meet basic needs , Home prepared for child    Risk Factors/Current Problems:  None   Cognitive State:  Alert , Able to Concentrate    Mood/Affect:  Happy    CSW Assessment:  Acknowledged order for social work consult to assess mother's hx of substance abuse.  Per chart review, mother has hx of IV drug use and cocaine.   Met with mother who was pleasant and receptive to CSW.    She and FOB got married about 2 days ago.  She reports extensive family support.  MOB states that in December while studying for finals, she was offered cocaine and had an allergic reaction to it.  She reports that this was an isolated incident and denies any other illicit drug use. Mother informed of chart review that indicated that patient had hx of IV drug use.  She laughed and state that she is unsure of the origin of this statement, but she has never  experimented with any other illicit drug.  UDS on newborn was negative.     Mother reports hx of mental illness.  Informed that she was treated for anxiety and depression in the past with mediation and therapy.  She was reportedly on medication prior to pregnancy and stop taking the medication because of the pregnancy.   She denies any current symptoms of anxiety or depression and states that she will resume medication if needed.   Informed that she is well prepared at home for newborn.  Mother informed of social work Fish farm manager.  CSW Plan/Description:     Mother informed of the hospital's drug screening policy No barriers to discharge Will continue to monitor drug screen.    Bevel, Cumi J, LCSW 11/30/2014, 2:56 PM

## 2014-11-30 NOTE — Progress Notes (Signed)
Post Partum Day 2 Subjective: no complaints, up ad lib, voiding, tolerating PO and + flatus  Objective: Blood pressure 112/65, pulse 51, temperature 98.3 F (36.8 C), temperature source Oral, resp. rate 18, height 5' 5.5" (1.664 m), weight 162 lb 3.2 oz (73.573 kg), SpO2 100 %.  Physical Exam:  General: alert, cooperative and no distress Lochia: appropriate Uterine Fundus: firm Incision: healing well DVT Evaluation: No evidence of DVT seen on physical exam.   Recent Labs  11/28/14 0220 11/29/14 0545  HGB 11.3* 9.1*  HCT 34.1* 28.0*    Assessment/Plan: Discharge home  Social Services consult ordered yesterday-not done Nurse will contact that office to day prior to patient discharge   LOS: 2 days   TOMBLIN II,JAMES E 11/30/2014, 7:12 AM

## 2014-12-04 ENCOUNTER — Inpatient Hospital Stay (HOSPITAL_COMMUNITY): Admit: 2014-12-04 | Payer: 59

## 2015-01-23 ENCOUNTER — Emergency Department (HOSPITAL_BASED_OUTPATIENT_CLINIC_OR_DEPARTMENT_OTHER): Payer: 59

## 2015-01-23 ENCOUNTER — Emergency Department (HOSPITAL_BASED_OUTPATIENT_CLINIC_OR_DEPARTMENT_OTHER)
Admission: EM | Admit: 2015-01-23 | Discharge: 2015-01-23 | Disposition: A | Discharge status: Home / Self Care | Payer: 59 | Attending: Emergency Medicine | Admitting: Emergency Medicine

## 2015-01-23 ENCOUNTER — Encounter (HOSPITAL_BASED_OUTPATIENT_CLINIC_OR_DEPARTMENT_OTHER): Payer: Self-pay | Admitting: *Deleted

## 2015-01-23 DIAGNOSIS — Z79899 Other long term (current) drug therapy: Secondary | ICD-10-CM | POA: Diagnosis not present

## 2015-01-23 DIAGNOSIS — Y9389 Activity, other specified: Secondary | ICD-10-CM | POA: Insufficient documentation

## 2015-01-23 DIAGNOSIS — S6991XA Unspecified injury of right wrist, hand and finger(s), initial encounter: Secondary | ICD-10-CM | POA: Diagnosis present

## 2015-01-23 DIAGNOSIS — W228XXA Striking against or struck by other objects, initial encounter: Secondary | ICD-10-CM | POA: Insufficient documentation

## 2015-01-23 DIAGNOSIS — S62396A Other fracture of fifth metacarpal bone, right hand, initial encounter for closed fracture: Secondary | ICD-10-CM | POA: Insufficient documentation

## 2015-01-23 DIAGNOSIS — S62306A Unspecified fracture of fifth metacarpal bone, right hand, initial encounter for closed fracture: Secondary | ICD-10-CM

## 2015-01-23 DIAGNOSIS — Y998 Other external cause status: Secondary | ICD-10-CM | POA: Diagnosis not present

## 2015-01-23 DIAGNOSIS — Y9289 Other specified places as the place of occurrence of the external cause: Secondary | ICD-10-CM | POA: Diagnosis not present

## 2015-01-23 DIAGNOSIS — S6402XA Injury of ulnar nerve at wrist and hand level of left arm, initial encounter: Secondary | ICD-10-CM

## 2015-01-23 DIAGNOSIS — Z87891 Personal history of nicotine dependence: Secondary | ICD-10-CM | POA: Diagnosis not present

## 2015-01-23 DIAGNOSIS — J45909 Unspecified asthma, uncomplicated: Secondary | ICD-10-CM | POA: Insufficient documentation

## 2015-01-23 MED ORDER — IBUPROFEN 400 MG PO TABS
600.0000 mg | ORAL_TABLET | Freq: Once | ORAL | Status: AC
  Administered 2015-01-23: 600 mg via ORAL

## 2015-01-23 MED ORDER — HYDROCODONE-ACETAMINOPHEN 5-325 MG PO TABS: 1.0000 | ORAL_TABLET | ORAL | Status: DC | PRN

## 2015-01-23 MED ORDER — OXYCODONE-ACETAMINOPHEN 5-325 MG PO TABS
1.0000 | ORAL_TABLET | Freq: Once | ORAL | Status: AC
  Administered 2015-01-23: 1 via ORAL

## 2015-01-23 NOTE — Discharge Instructions (Signed)
Cast or Splint Care Casts and splints support injured limbs and keep bones from moving while they heal.  HOME CARE  Keep the cast or splint uncovered during the drying period.  A plaster cast can take 24 to 48 hours to dry.  A fiberglass cast will dry in less than 1 hour.  Do not rest the cast on anything harder than a pillow for 24 hours.  Do not put weight on your injured limb. Do not put pressure on the cast. Wait for your doctor's approval.  Keep the cast or splint dry.  Cover the cast or splint with a plastic bag during baths or wet weather.  If you have a cast over your chest and belly (trunk), take sponge baths until the cast is taken off.  If your cast gets wet, dry it with a towel or blow dryer. Use the cool setting on the blow dryer.  Keep your cast or splint clean. Wash a dirty cast with a damp cloth.  Do not put any objects under your cast or splint.  Do not scratch the skin under the cast with an object. If itching is a problem, use a blow dryer on a cool setting over the itchy area.  Do not trim or cut your cast.  Do not take out the padding from inside your cast.  Exercise your joints near the cast as told by your doctor.  Raise (elevate) your injured limb on 1 or 2 pillows for the first 1 to 3 days. GET HELP IF:  Your cast or splint cracks.  Your cast or splint is too tight or too loose.  You itch badly under the cast.  Your cast gets wet or has a soft spot.  You have a bad smell coming from the cast.  You get an object stuck under the cast.  Your skin around the cast becomes red or sore.  You have new or more pain after the cast is put on. GET HELP RIGHT AWAY IF:  You have fluid leaking through the cast.  You cannot move your fingers or toes.  Your fingers or toes turn blue or white or are cool, painful, or puffy (swollen).  You have tingling or lose feeling (numbness) around the injured area.  You have bad pain or pressure under the  cast.  You have trouble breathing or have shortness of breath.  You have chest pain.   This information is not intended to replace advice given to you by your health care provider. Make sure you discuss any questions you have with your health care provider.   Document Released: 06/23/2010 Document Revised: 10/24/2012 Document Reviewed: 08/30/2012 Elsevier Interactive Patient Education 2016 [employer]  Neurapraxia Neurapraxia is a temporary loss of nerve function. It does not cause permanent damage to a nerve. If your foot "falls asleep," that is a type of neurapraxia. It will go away as soon as you start moving your foot. A more serious neurapraxia could take up to 6 weeks to go away. CAUSES   Anything that strains a nerve can cause neurapraxia. The nerve might be stretched or twisted. Something might press or pound on it and cause decreased blood flow to the nerve. Causes of neurapraxia can include:  Bones that break (fracture) or move out of place (dislocation). This can cause the nerves to stretch or twist out of their normal position. This happens most often in the arms and shoulders.  Neck strain when the head moves suddenly, such as  in a car crash. This is called whiplash (cervical neurapraxia). It can also happen while playing sports. For example, a football injury called a stinger is a type of neurapraxia. It causes severe pain to shoot down an arm.  Stretching the neck too far from the shoulder. This damages the nerves that go into the shoulder, arm, and hand. It causes numbness and muscle weakness. This condition is called brachial plexus neurapraxia.  Repeated or prolonged pressure can cause decreased blood flow to the nerve (ischemic neurapraxia). Such causes of neurapraxia can include:  A cast or bandage that is too tight around an injured arm or leg. This limits blood flow to the nerves and causes a condition called compartment syndrome. This condition develops when  pressure builds up around muscles and nerves.  Infection and disease. This can cut off blood flow to the nerve.  Very cold temperatures.  Sleeping in a position that limits blood flow to your leg or arm. SIGNS AND SYMPTOMS  Numbness and tingling.  Muscle weakness.  Burning pain.  Cool skin. DIAGNOSIS  Neurapraxia is diagnosed through:  A physical exam. This will include asking questions about your health. Your health care provider will ask about any symptoms you are having. Your health care provider may also:  Test how strong your muscles are.  Check feeling (sensation) in various areas of your body. A very light touch or pricks with a pin may be used.  Check whether you have signs of nerve damage on one side of the body or both.  Tests such as:  Electromyography (EMG). This test measures electrical activity in a muscle. It shows whether the nerve that supplies the muscle is working.  Nerve conduction studies (NCS). They measure the flow of electricity through a nerve.  Magnetic resonance imaging (MRI). This is a machine that uses magnets and a computer to create pictures of your nerves. TREATMENT  Treatment aims to ease pain and swelling and to provide support while your body heals.  Medicine may include:  Pain medicine.  Antidepressants.  Seizure medicine.  Medicine to reduce swelling.  For support, options may include:  Braces, walkers, or crutches.  Physical therapy. Having a specialist work with you often speeds healing. It can also help prevent stiffness and future damage.  Surgery. This may be needed if broken or dislocated bones are part of the problem. Surgery also may be done to relieve pressure on nerves or to restore normal blood flow to nerves and muscles.  Electrical stimulators. These devices send pulses of electricity into the muscles. The aim is to bring back movement. HOME CARE INSTRUCTIONS What you need to do at home will vary. It will  depend on your treatment plan and the type of neurapraxia you have. In general:  Take medicine as told by your health care provider. Follow the directions carefully.  Rest. Give your body time to heal.  Use any splints, braces, or other support devices as directed. If you have questions about their use, ask your health care provider.  Start physical therapy, if that is suggested. Ask if it is okay to practice the exercises at home, too.  If areas of your body are numb, take care to protect them from burns or other injury.  If you had surgery, you will need to care for your surgical cut (incision). Ask for instructions before you leave the hospital.  Keep all follow-up appointments with your health care provider. SEEK MEDICAL CARE IF:   You have any  questions about your medicine.  Numbness or muscle weakness continues.  Pain continues, even after taking pain medicine. SEEK IMMEDIATE MEDICAL CARE IF:   Your pain suddenly becomes severe.  Numbness or weakness gets much worse.  Your muscles start to twitch or you have muscle spasms.   This information is not intended to replace advice given to you by your health care provider. Make sure you discuss any questions you have with your health care provider.   Document Released: 07/26/2010 Document Revised: 03/14/2014 Document Reviewed: 08/25/2014 Elsevier Interactive Patient Education [employer]
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
## 2015-01-23 NOTE — ED Notes (Signed)
Patient transported to X-ray 

## 2015-01-23 NOTE — ED Provider Notes (Signed)
CSN: [id number]     Arrival date & time 01/23/15  0756 History   First MD Initiated Contact with Patient 01/23/15 [phone]     Chief Complaint  Patient presents with  . Hand Pain     (Consider location/radiation/quality/duration/timing/severity/associated sxs/prior Treatment) HPI   24 year old female with right hand pain. Onset shortly before arrival. Patient became upset and punched a desk. Immediate onset of severe pain towards her lower aspect of her right hand. Has been persistent since then. Some mild swelling. Numbness in her ring and pinky fingers. Denies any other acute pain. No intervention prior to arrival.  Past Medical History  Diagnosis Date  . Asthma     last inhaler use "years ago"   Past Surgical History  Procedure Laterality Date  . Wisdom tooth extraction     History reviewed. No pertinent family history. Social History  Substance Use Topics  . Smoking status: Former Games developer  . Smokeless tobacco: None  . Alcohol Use: No   OB History    Gravida Para Term Preterm AB TAB SAB Ectopic Multiple Living   0 1     Review of Systems  All systems reviewed and negative, other than as noted in HPI.   Allergies  Review of patient's allergies indicates no known allergies.  Home Medications   Prior to Admission medications   Medication Sig Start Date End Date Taking? Authorizing Provider  acetaminophen (TYLENOL) 500 MG tablet Take 500 mg by mouth every 6 (six) hours as needed for moderate pain or headache.     Historical Provider, MD  albuterol (PROVENTIL HFA;VENTOLIN HFA) 108 (90 BASE) MCG/ACT inhaler Inhale 2 puffs into the lungs every 4 (four) hours as needed for wheezing or shortness of breath.     Historical Provider, MD  Prenatal Vit-Fe Fumarate-FA (PRENATAL MULTIVITAMIN) TABS tablet Take 1 tablet by mouth daily at 12 noon.    Historical Provider, MD   BP 117/81 mmHg  Pulse 98  Temp(Src) 98.1 F (36.7 C) (Oral)  Resp 18  Ht  (1.727 m)  Wt  145 lb (65.772 kg)  BMI 22.05 kg/m2  SpO2 100%  Breastfeeding? Yes Physical Exam  Constitutional: She appears well-developed and well-nourished. No distress.  HENT:  Head: Normocephalic and atraumatic.  Eyes: Conjunctivae are normal. Right eye exhibits no discharge. Left eye exhibits no discharge.  Neck: Neck supple.  Cardiovascular: Normal rate, regular rhythm and normal heart sounds.  Exam reveals no gallop and no friction rub.   No murmur heard. Pulmonary/Chest: Effort normal and breath sounds normal. No respiratory distress.  Abdominal: Soft. She exhibits no distension. There is no tenderness.  Musculoskeletal:  Mild swelling and faint ecchymosis over the ulnar aspect of the right hand. Tenderness to palpation in the same location. Closed injury. No significant tenderness of the proximal hand or wrist. No apparent discomfort with range of motion at the wrist. Decreased sensation to light touch along the pinky finger and ulnar aspect of the ring finger. Limited abduction/adduction of fourth and fifth fingers. Limited range of motion and MP and interphalangeal joints of fourth and fifth fingers as well. Significant pain with attempted ROM and difficult to discrern how much is pain related versus true deficit.   Neurological: She is alert.  Skin: Skin is warm and dry.  Psychiatric: She has a normal mood and affect. Her behavior is normal. Thought content normal.  Nursing note and vitals reviewed.   ED Course  Procedures (  including critical care time) Labs Review Labs Reviewed - No data to display  Imaging Review Dg Hand Complete Right  01/23/2015  CLINICAL DATA:  Acute right hand pain and swelling after punching desk this morning. Initial encounter. EXAM: RIGHT HAND - COMPLETE 3+ VIEW COMPARISON:  None. FINDINGS: Mildly displaced oblique fracture is seen involving the proximal portion of the fifth metacarpal. This appears to be closed and posttraumatic. No other fracture or bony  abnormality is noted. Joint spaces are intact. IMPRESSION: Mildly displaced oblique fracture involving proximal portion of fifth metacarpal. Electronically Signed   By: Lupita RaiderJames  Green Jr, M.D.   On: 01/23/2015 08:26   I have personally reviewed and evaluated these images and lab results as part of my medical decision-making.   EKG Interpretation None      MDM   Final diagnoses:  Fracture of fifth metacarpal bone of right hand, closed, initial encounter  Injury of left ulnar nerve at hand level, initial encounter    24 year old female with right hand pain after punching a desk. Imaging significant for oblique fracture at the base of the fifth metacarpal. Patient does have decreased sensation over the pinky and ulnar aspect of the ring finger. Difficult to fully assess motor function secondary to pain. Hopefully transient ulnar neuropathy associated with 5th metacarpal fx, but will need hand surgical follow-up. Discussed with patient/mother.  Will place in an ulnar gutter. When necessary pain medication.    Raeford RazorStephen Sylvio Weatherall, MD 02/06/15 [phone]

## 2017-05-05 ENCOUNTER — Emergency Department (HOSPITAL_COMMUNITY): Payer: 59

## 2017-05-05 ENCOUNTER — Emergency Department (HOSPITAL_COMMUNITY): Payer: 59 | Admitting: Anesthesiology

## 2017-05-05 ENCOUNTER — Encounter (HOSPITAL_COMMUNITY): Payer: Self-pay | Admitting: Certified Registered Nurse Anesthetist

## 2017-05-05 ENCOUNTER — Observation Stay (HOSPITAL_COMMUNITY)
Admission: EM | Admit: 2017-05-05 | Discharge: 2017-05-06 | Disposition: A | Discharge status: Home / Self Care | Payer: 59 | Attending: Student | Admitting: Student

## 2017-05-05 ENCOUNTER — Inpatient Hospital Stay (HOSPITAL_COMMUNITY): Payer: 59

## 2017-05-05 ENCOUNTER — Encounter (HOSPITAL_COMMUNITY)
Admission: EM | Disposition: A | Discharge status: Home / Self Care | Payer: Self-pay | Source: Home / Self Care | Attending: Physician Assistant

## 2017-05-05 DIAGNOSIS — F17210 Nicotine dependence, cigarettes, uncomplicated: Secondary | ICD-10-CM | POA: Insufficient documentation

## 2017-05-05 DIAGNOSIS — Y92009 Unspecified place in unspecified non-institutional (private) residence as the place of occurrence of the external cause: Secondary | ICD-10-CM | POA: Insufficient documentation

## 2017-05-05 DIAGNOSIS — Z79899 Other long term (current) drug therapy: Secondary | ICD-10-CM | POA: Insufficient documentation

## 2017-05-05 DIAGNOSIS — S82402A Unspecified fracture of shaft of left fibula, initial encounter for closed fracture: Secondary | ICD-10-CM | POA: Insufficient documentation

## 2017-05-05 DIAGNOSIS — J45909 Unspecified asthma, uncomplicated: Secondary | ICD-10-CM | POA: Insufficient documentation

## 2017-05-05 DIAGNOSIS — S82872B Displaced pilon fracture of left tibia, initial encounter for open fracture type I or II: Principal | ICD-10-CM | POA: Insufficient documentation

## 2017-05-05 DIAGNOSIS — T148XXA Other injury of unspecified body region, initial encounter: Secondary | ICD-10-CM

## 2017-05-05 DIAGNOSIS — S82042A Displaced comminuted fracture of left patella, initial encounter for closed fracture: Secondary | ICD-10-CM | POA: Diagnosis present

## 2017-05-05 DIAGNOSIS — Z888 Allergy status to other drugs, medicaments and biological substances status: Secondary | ICD-10-CM | POA: Insufficient documentation

## 2017-05-05 DIAGNOSIS — S82899B Other fracture of unspecified lower leg, initial encounter for open fracture type I or II: Secondary | ICD-10-CM

## 2017-05-05 DIAGNOSIS — W1809XA Striking against other object with subsequent fall, initial encounter: Secondary | ICD-10-CM | POA: Insufficient documentation

## 2017-05-05 DIAGNOSIS — Y9389 Activity, other specified: Secondary | ICD-10-CM | POA: Insufficient documentation

## 2017-05-05 DIAGNOSIS — S82202B Unspecified fracture of shaft of left tibia, initial encounter for open fracture type I or II: Secondary | ICD-10-CM | POA: Diagnosis present

## 2017-05-05 DIAGNOSIS — S82402B Unspecified fracture of shaft of left fibula, initial encounter for open fracture type I or II: Secondary | ICD-10-CM

## 2017-05-05 DIAGNOSIS — Y998 Other external cause status: Secondary | ICD-10-CM | POA: Insufficient documentation

## 2017-05-05 DIAGNOSIS — Z7982 Long term (current) use of aspirin: Secondary | ICD-10-CM | POA: Insufficient documentation

## 2017-05-05 HISTORY — PX: TIBIA IM NAIL INSERTION: SHX2516

## 2017-05-05 LAB — SURGICAL PCR SCREEN
MRSA, PCR: NEGATIVE
STAPHYLOCOCCUS AUREUS: NEGATIVE

## 2017-05-05 LAB — POCT I-STAT 4, (NA,K, GLUC, HGB,HCT)
Glucose, Bld: 118 mg/dL — ABNORMAL HIGH (ref 65–99)
HCT: 35 % — ABNORMAL LOW (ref 36.0–46.0)
Hemoglobin: 11.9 g/dL — ABNORMAL LOW (ref 12.0–15.0)
Potassium: 3.4 mmol/L — ABNORMAL LOW (ref 3.5–5.1)
SODIUM: 139 mmol/L (ref 135–145)

## 2017-05-05 LAB — I-STAT BETA HCG BLOOD, ED (MC, WL, AP ONLY): I-stat hCG, quantitative: 208 m[IU]/mL — ABNORMAL HIGH (ref <5)

## 2017-05-05 SURGERY — INSERTION, INTRAMEDULLARY ROD, TIBIA
Anesthesia: General | Site: Leg Lower | Laterality: Left

## 2017-05-05 MED ORDER — ONDANSETRON HCL 4 MG/2ML IJ SOLN
4.0000 mg | Freq: Once | INTRAMUSCULAR | Status: AC
  Administered 2017-05-05: 4 mg via INTRAVENOUS

## 2017-05-05 MED ORDER — ROCURONIUM BROMIDE 100 MG/10ML IV SOLN
INTRAVENOUS | Status: DC | PRN
  Administered 2017-05-05: 10 mg via INTRAVENOUS
  Administered 2017-05-05: 40 mg via INTRAVENOUS

## 2017-05-05 MED ORDER — LIDOCAINE HCL (CARDIAC) 20 MG/ML IV SOLN
INTRAVENOUS | Status: DC | PRN
  Administered 2017-05-05: 60 mg via INTRAVENOUS

## 2017-05-05 MED ORDER — FENTANYL CITRATE (PF) 100 MCG/2ML IJ SOLN
INTRAMUSCULAR | Status: DC | PRN
  Administered 2017-05-05: 100 ug via INTRAVENOUS
  Administered 2017-05-05 (×3): 50 ug via INTRAVENOUS

## 2017-05-05 MED ORDER — LIDOCAINE 2% (20 MG/ML) 5 ML SYRINGE
INTRAMUSCULAR | Status: AC
  Filled 2017-05-05: qty 20

## 2017-05-05 MED ORDER — NEOSTIGMINE METHYLSULFATE 5 MG/5ML IV SOSY
PREFILLED_SYRINGE | INTRAVENOUS | Status: DC | PRN
  Administered 2017-05-05: 3 mg via INTRAVENOUS

## 2017-05-05 MED ORDER — ROPINIROLE HCL 1 MG PO TABS
1.0000 mg | ORAL_TABLET | Freq: Every day | ORAL | Status: DC
  Administered 2017-05-05: 1 mg via ORAL
  Filled 2017-05-05: qty 1

## 2017-05-05 MED ORDER — TETANUS-DIPHTH-ACELL PERTUSSIS 5-2.5-18.5 LF-MCG/0.5 IM SUSP: 0.5000 mL | Freq: Once | INTRAMUSCULAR | Status: DC

## 2017-05-05 MED ORDER — BUPROPION HCL ER (XL) 150 MG PO TB24
300.0000 mg | ORAL_TABLET | Freq: Every day | ORAL | Status: DC
  Administered 2017-05-05 – 2017-05-06 (×2): 300 mg via ORAL
  Filled 2017-05-05 (×2): qty 2

## 2017-05-05 MED ORDER — ALBUTEROL SULFATE (2.5 MG/3ML) 0.083% IN NEBU: 2.5000 mg | INHALATION_SOLUTION | RESPIRATORY_TRACT | Status: DC | PRN

## 2017-05-05 MED ORDER — HYDROMORPHONE HCL 1 MG/ML IJ SOLN
INTRAMUSCULAR | Status: DC | PRN
  Administered 2017-05-05 (×2): 0.5 mg via INTRAVENOUS

## 2017-05-05 MED ORDER — MIDAZOLAM HCL 2 MG/2ML IJ SOLN
INTRAMUSCULAR | Status: AC
  Filled 2017-05-05: qty 2

## 2017-05-05 MED ORDER — FENTANYL CITRATE (PF) 100 MCG/2ML IJ SOLN
INTRAMUSCULAR | Status: AC
  Filled 2017-05-05: qty 2

## 2017-05-05 MED ORDER — SUCCINYLCHOLINE CHLORIDE 200 MG/10ML IV SOSY
PREFILLED_SYRINGE | INTRAVENOUS | Status: AC
  Filled 2017-05-05: qty 20

## 2017-05-05 MED ORDER — CHLORHEXIDINE GLUCONATE 4 % EX LIQD: 60.0000 mL | Freq: Once | CUTANEOUS | Status: DC

## 2017-05-05 MED ORDER — BSS IO SOLN
INTRAOCULAR | Status: AC
  Filled 2017-05-05: qty 15

## 2017-05-05 MED ORDER — 0.9 % SODIUM CHLORIDE (POUR BTL) OPTIME
TOPICAL | Status: DC | PRN
  Administered 2017-05-05: 1000 mL

## 2017-05-05 MED ORDER — DOCUSATE SODIUM 100 MG PO CAPS
100.0000 mg | ORAL_CAPSULE | Freq: Two times a day (BID) | ORAL | Status: DC
  Administered 2017-05-05 – 2017-05-06 (×2): 100 mg via ORAL
  Filled 2017-05-05 (×2): qty 1

## 2017-05-05 MED ORDER — TRAZODONE HCL 100 MG PO TABS
100.0000 mg | ORAL_TABLET | Freq: Every evening | ORAL | Status: DC | PRN
  Administered 2017-05-05: 100 mg via ORAL
  Filled 2017-05-05: qty 1

## 2017-05-05 MED ORDER — PROPOFOL 10 MG/ML IV BOLUS
INTRAVENOUS | Status: AC
  Filled 2017-05-05: qty 40

## 2017-05-05 MED ORDER — ONDANSETRON HCL 4 MG/2ML IJ SOLN
4.0000 mg | Freq: Four times a day (QID) | INTRAMUSCULAR | Status: DC | PRN
  Administered 2017-05-05: 4 mg via INTRAVENOUS
  Filled 2017-05-05: qty 2

## 2017-05-05 MED ORDER — PANTOPRAZOLE SODIUM 40 MG PO TBEC
40.0000 mg | DELAYED_RELEASE_TABLET | Freq: Every day | ORAL | Status: DC
  Administered 2017-05-05 – 2017-05-06 (×2): 40 mg via ORAL
  Filled 2017-05-05 (×2): qty 1

## 2017-05-05 MED ORDER — SENNA 8.6 MG PO TABS
1.0000 | ORAL_TABLET | Freq: Two times a day (BID) | ORAL | Status: DC
  Administered 2017-05-05 – 2017-05-06 (×2): 8.6 mg via ORAL
  Filled 2017-05-05 (×2): qty 1

## 2017-05-05 MED ORDER — OXYCODONE-ACETAMINOPHEN 5-325 MG PO TABS
1.0000 | ORAL_TABLET | ORAL | Status: DC | PRN
  Administered 2017-05-05: 2 via ORAL
  Administered 2017-05-05 (×2): 1 via ORAL
  Administered 2017-05-06: 2 via ORAL
  Filled 2017-05-05: qty 1
  Filled 2017-05-05 (×2): qty 2

## 2017-05-05 MED ORDER — METHOCARBAMOL 1000 MG/10ML IJ SOLN
500.0000 mg | Freq: Four times a day (QID) | INTRAVENOUS | Status: DC | PRN
  Filled 2017-05-05: qty 5

## 2017-05-05 MED ORDER — OXYCODONE-ACETAMINOPHEN 5-325 MG PO TABS
ORAL_TABLET | ORAL | Status: AC
  Filled 2017-05-05: qty 1

## 2017-05-05 MED ORDER — GLYCOPYRROLATE 0.2 MG/ML IV SOSY
PREFILLED_SYRINGE | INTRAVENOUS | Status: DC | PRN
  Administered 2017-05-05: 0.4 mg via INTRAVENOUS
  Administered 2017-05-05: .2 mg via INTRAVENOUS

## 2017-05-05 MED ORDER — MIDAZOLAM HCL 5 MG/5ML IJ SOLN
INTRAMUSCULAR | Status: DC | PRN
  Administered 2017-05-05: 2 mg via INTRAVENOUS

## 2017-05-05 MED ORDER — PHENYLEPHRINE 40 MCG/ML (10ML) SYRINGE FOR IV PUSH (FOR BLOOD PRESSURE SUPPORT)
PREFILLED_SYRINGE | INTRAVENOUS | Status: AC
  Filled 2017-05-05: qty 20

## 2017-05-05 MED ORDER — ONDANSETRON HCL 4 MG PO TABS: 4.0000 mg | ORAL_TABLET | Freq: Four times a day (QID) | ORAL | Status: DC | PRN

## 2017-05-05 MED ORDER — BACITRACIN ZINC 500 UNIT/GM EX OINT
TOPICAL_OINTMENT | CUTANEOUS | Status: AC
  Filled 2017-05-05: qty 28.35

## 2017-05-05 MED ORDER — SUCCINYLCHOLINE CHLORIDE 20 MG/ML IJ SOLN
INTRAMUSCULAR | Status: DC | PRN
  Administered 2017-05-05: 100 mg via INTRAVENOUS

## 2017-05-05 MED ORDER — ALBUTEROL SULFATE HFA 108 (90 BASE) MCG/ACT IN AERS: 2.0000 | INHALATION_SPRAY | RESPIRATORY_TRACT | Status: DC | PRN

## 2017-05-05 MED ORDER — ROCURONIUM BROMIDE 50 MG/5ML IV SOLN
INTRAVENOUS | Status: AC
  Filled 2017-05-05: qty 1

## 2017-05-05 MED ORDER — HYDROMORPHONE HCL 1 MG/ML IJ SOLN
INTRAMUSCULAR | Status: AC
  Filled 2017-05-05: qty 0.5

## 2017-05-05 MED ORDER — LACTATED RINGERS IV SOLN
INTRAVENOUS | Status: DC
  Administered 2017-05-05 (×2): via INTRAVENOUS

## 2017-05-05 MED ORDER — FENTANYL CITRATE (PF) 100 MCG/2ML IJ SOLN: 100.0000 ug | Freq: Once | INTRAMUSCULAR | Status: DC

## 2017-05-05 MED ORDER — FENTANYL CITRATE (PF) 100 MCG/2ML IJ SOLN
25.0000 ug | INTRAMUSCULAR | Status: DC | PRN
  Administered 2017-05-05 (×2): 50 ug via INTRAVENOUS

## 2017-05-05 MED ORDER — PHENYLEPHRINE 40 MCG/ML (10ML) SYRINGE FOR IV PUSH (FOR BLOOD PRESSURE SUPPORT)
PREFILLED_SYRINGE | INTRAVENOUS | Status: DC | PRN
  Administered 2017-05-05 (×2): 40 ug via INTRAVENOUS

## 2017-05-05 MED ORDER — MORPHINE SULFATE (PF) 2 MG/ML IV SOLN
2.0000 mg | INTRAVENOUS | Status: DC | PRN
  Administered 2017-05-05 – 2017-05-06 (×8): 2 mg via INTRAVENOUS
  Filled 2017-05-05 (×8): qty 1

## 2017-05-05 MED ORDER — PROMETHAZINE HCL 25 MG/ML IJ SOLN: 6.2500 mg | INTRAMUSCULAR | Status: DC | PRN

## 2017-05-05 MED ORDER — CEFAZOLIN SODIUM-DEXTROSE 2-4 GM/100ML-% IV SOLN: 2.0000 g | INTRAVENOUS | Status: DC

## 2017-05-05 MED ORDER — VANCOMYCIN HCL 1000 MG IV SOLR
INTRAVENOUS | Status: AC
  Filled 2017-05-05: qty 1000

## 2017-05-05 MED ORDER — VANCOMYCIN HCL 500 MG IV SOLR
INTRAVENOUS | Status: AC
  Filled 2017-05-05: qty 500

## 2017-05-05 MED ORDER — CEFAZOLIN SODIUM-DEXTROSE 2-4 GM/100ML-% IV SOLN
2.0000 g | Freq: Once | INTRAVENOUS | Status: AC
  Administered 2017-05-05: 2 g via INTRAVENOUS
  Filled 2017-05-05: qty 100

## 2017-05-05 MED ORDER — METHOCARBAMOL 500 MG PO TABS
ORAL_TABLET | ORAL | Status: AC
  Filled 2017-05-05: qty 1

## 2017-05-05 MED ORDER — DIPHENHYDRAMINE HCL 12.5 MG/5ML PO ELIX: 12.5000 mg | ORAL_SOLUTION | ORAL | Status: DC | PRN

## 2017-05-05 MED ORDER — DEXAMETHASONE SODIUM PHOSPHATE 10 MG/ML IJ SOLN
INTRAMUSCULAR | Status: DC | PRN
  Administered 2017-05-05: 10 mg via INTRAVENOUS

## 2017-05-05 MED ORDER — ROCURONIUM BROMIDE 50 MG/5ML IV SOLN
INTRAVENOUS | Status: AC
  Filled 2017-05-05: qty 3

## 2017-05-05 MED ORDER — CEFAZOLIN SODIUM-DEXTROSE 2-4 GM/100ML-% IV SOLN
2.0000 g | Freq: Three times a day (TID) | INTRAVENOUS | Status: DC
  Administered 2017-05-05 – 2017-05-06 (×2): 2 g via INTRAVENOUS
  Filled 2017-05-05 (×3): qty 100

## 2017-05-05 MED ORDER — METHOCARBAMOL 500 MG PO TABS
500.0000 mg | ORAL_TABLET | Freq: Four times a day (QID) | ORAL | Status: DC | PRN
  Administered 2017-05-05 – 2017-05-06 (×3): 500 mg via ORAL
  Filled 2017-05-05 (×2): qty 1

## 2017-05-05 MED ORDER — POVIDONE-IODINE 10 % EX SWAB: 2.0000 "application " | Freq: Once | CUTANEOUS | Status: DC

## 2017-05-05 MED ORDER — VANCOMYCIN HCL IN DEXTROSE 1-5 GM/200ML-% IV SOLN
INTRAVENOUS | Status: AC
  Filled 2017-05-05: qty 200

## 2017-05-05 MED ORDER — NEOSTIGMINE METHYLSULFATE 5 MG/5ML IV SOSY
PREFILLED_SYRINGE | INTRAVENOUS | Status: AC
  Filled 2017-05-05: qty 5

## 2017-05-05 MED ORDER — FENTANYL CITRATE (PF) 250 MCG/5ML IJ SOLN
INTRAMUSCULAR | Status: AC
  Filled 2017-05-05: qty 5

## 2017-05-05 MED ORDER — PROPOFOL 10 MG/ML IV BOLUS
INTRAVENOUS | Status: DC | PRN
  Administered 2017-05-05: 180 mg via INTRAVENOUS

## 2017-05-05 MED ORDER — ONDANSETRON HCL 4 MG/2ML IJ SOLN
INTRAMUSCULAR | Status: AC
  Filled 2017-05-05: qty 6

## 2017-05-05 MED ORDER — ACETAMINOPHEN 325 MG PO TABS
650.0000 mg | ORAL_TABLET | Freq: Four times a day (QID) | ORAL | Status: DC | PRN
  Administered 2017-05-05 – 2017-05-06 (×3): 650 mg via ORAL
  Filled 2017-05-05 (×3): qty 2

## 2017-05-05 MED ORDER — VANCOMYCIN HCL 1000 MG IV SOLR
INTRAVENOUS | Status: DC | PRN
  Administered 2017-05-05: 1000 mg

## 2017-05-05 MED ORDER — KETOROLAC TROMETHAMINE 30 MG/ML IJ SOLN
INTRAMUSCULAR | Status: AC
  Filled 2017-05-05: qty 1

## 2017-05-05 MED ORDER — DEXAMETHASONE SODIUM PHOSPHATE 10 MG/ML IJ SOLN
INTRAMUSCULAR | Status: AC
  Filled 2017-05-05: qty 4

## 2017-05-05 MED ORDER — EPHEDRINE 5 MG/ML INJ
INTRAVENOUS | Status: AC
  Filled 2017-05-05: qty 10

## 2017-05-05 MED ORDER — ACETAMINOPHEN 650 MG RE SUPP: 650.0000 mg | Freq: Four times a day (QID) | RECTAL | Status: DC | PRN

## 2017-05-05 SURGICAL SUPPLY — 78 items
BANDAGE ELASTIC 6 VELCRO ST LF (GAUZE/BANDAGES/DRESSINGS) ×3 IMPLANT
BANDAGE ESMARK 6X9 LF (GAUZE/BANDAGES/DRESSINGS) ×2 IMPLANT
BIT DRILL CAL 3.2 LONG (BIT) ×2 IMPLANT
BIT DRILL CAL 3.2MM LONG (BIT) ×1
BIT DRILL SHORT 3.2MM (DRILL) ×2 IMPLANT
BLADE CLIPPER SURG (BLADE) IMPLANT
BNDG ADH 5X4 AIR PERM ELC (GAUZE/BANDAGES/DRESSINGS) ×2
BNDG CMPR 9X6 STRL LF SNTH (GAUZE/BANDAGES/DRESSINGS) ×2
BNDG COHESIVE 4X5 TAN STRL (GAUZE/BANDAGES/DRESSINGS) IMPLANT
BNDG COHESIVE 4X5 WHT NS (GAUZE/BANDAGES/DRESSINGS) ×3 IMPLANT
BNDG ESMARK 6X9 LF (GAUZE/BANDAGES/DRESSINGS) ×4
BNDG GAUZE ELAST 4 BULKY (GAUZE/BANDAGES/DRESSINGS) ×4 IMPLANT
BRUSH SCRUB SURG 4.25 DISP (MISCELLANEOUS) ×8 IMPLANT
CHLORAPREP W/TINT 26ML (MISCELLANEOUS) ×4 IMPLANT
COVER MAYO STAND STRL (DRAPES) ×4 IMPLANT
DRAPE C-ARM 42X72 X-RAY (DRAPES) ×4 IMPLANT
DRAPE C-ARMOR (DRAPES) ×4 IMPLANT
DRAPE HALF SHEET 40X57 (DRAPES) ×8 IMPLANT
DRAPE INCISE IOBAN 66X45 STRL (DRAPES) ×4 IMPLANT
DRAPE U-SHAPE 47X51 STRL (DRAPES) ×4 IMPLANT
DRILL SHORT 3.2MM (DRILL) ×8
DRSG ADAPTIC 3X8 NADH LF (GAUZE/BANDAGES/DRESSINGS) ×4 IMPLANT
DRSG PAD ABDOMINAL 8X10 ST (GAUZE/BANDAGES/DRESSINGS) ×16 IMPLANT
ELECT REM PT RETURN 9FT ADLT (ELECTROSURGICAL) ×4
ELECTRODE REM PT RTRN 9FT ADLT (ELECTROSURGICAL) ×2 IMPLANT
GAUZE SPONGE 4X4 12PLY STRL (GAUZE/BANDAGES/DRESSINGS) ×4 IMPLANT
GLOVE BIO SURGEON STRL SZ7.5 (GLOVE) ×16 IMPLANT
GLOVE BIOGEL PI IND STRL 7.5 (GLOVE) ×2 IMPLANT
GLOVE BIOGEL PI INDICATOR 7.5 (GLOVE) ×2
GLOVE PROGUARD SZ 7 1/2 (GLOVE) ×4 IMPLANT
GLOVE XGUARD RR 2 7.5 (GLOVE) ×1 IMPLANT
GLOVE XGUARD RR2 7.5 (GLOVE) ×4
GOWN STRL REUS W/ TWL LRG LVL3 (GOWN DISPOSABLE) ×4 IMPLANT
GOWN STRL REUS W/TWL LRG LVL3 (GOWN DISPOSABLE) ×8
GUIDEWIRE 3.2X400 (WIRE) ×6 IMPLANT
HANDPIECE INTERPULSE COAX TIP (DISPOSABLE) ×4
KIT BASIN OR (CUSTOM PROCEDURE TRAY) ×4 IMPLANT
KIT ROOM TURNOVER OR (KITS) ×4 IMPLANT
MANIFOLD NEPTUNE II (INSTRUMENTS) ×4 IMPLANT
NAIL TIBIAL EX 9X345 (Nail) ×4 IMPLANT
NS IRRIG 1000ML POUR BTL (IV SOLUTION) ×4 IMPLANT
PACK TOTAL JOINT (CUSTOM PROCEDURE TRAY) ×4 IMPLANT
PAD ABD 8X10 STRL (GAUZE/BANDAGES/DRESSINGS) ×4 IMPLANT
PAD ARMBOARD 7.5X6 YLW CONV (MISCELLANEOUS) ×8 IMPLANT
PAD CAST 4YDX4 CTTN HI CHSV (CAST SUPPLIES) ×2 IMPLANT
PADDING CAST COTTON 4X4 STRL (CAST SUPPLIES) ×4
PADDING CAST COTTON 6X4 STRL (CAST SUPPLIES) ×4 IMPLANT
REAMER ROD DEEP FLUTE 2.5X950 (INSTRUMENTS) ×3 IMPLANT
SCREW LOCK 4X44 TI (Screw) ×3 IMPLANT
SCREW LOCK T25 FT 32X4X3.3X (Screw) ×2 IMPLANT
SCREW LOCKING 4.0 28MM (Screw) ×3 IMPLANT
SCREW LOCKING 4.0 34MM (Screw) ×3 IMPLANT
SCREW LOCKING 4X32 (Screw) ×8 IMPLANT
SCREW LOCKING 4X38 (Screw) ×3 IMPLANT
SET HNDPC FAN SPRY TIP SCT (DISPOSABLE) ×1 IMPLANT
SPONGE LAP 18X18 X RAY DECT (DISPOSABLE) IMPLANT
STAPLER VISISTAT 35W (STAPLE) ×4 IMPLANT
STOCKINETTE IMPERVIOUS LG (DRAPES) IMPLANT
SUCTION FRAZIER HANDLE 10FR (MISCELLANEOUS) ×2
SUCTION TUBE FRAZIER 10FR DISP (MISCELLANEOUS) ×2 IMPLANT
SUT ETHILON 3 0 FSL (SUTURE) ×3 IMPLANT
SUT ETHILON 3 0 PS 1 (SUTURE) IMPLANT
SUT MNCRL AB 3-0 PS2 18 (SUTURE) ×4 IMPLANT
SUT MON AB 2-0 CT1 36 (SUTURE) ×3 IMPLANT
SUT PROLENE 0 CT (SUTURE) IMPLANT
SUT VIC AB 0 CT1 27 (SUTURE) ×8
SUT VIC AB 0 CT1 27XBRD ANBCTR (SUTURE) ×3 IMPLANT
SUT VIC AB 1 CT1 27 (SUTURE) ×4
SUT VIC AB 1 CT1 27XBRD ANBCTR (SUTURE) ×2 IMPLANT
SUT VIC AB 2-0 CT1 27 (SUTURE) ×8
SUT VIC AB 2-0 CT1 TAPERPNT 27 (SUTURE) ×4 IMPLANT
TOWEL OR 17X24 6PK STRL BLUE (TOWEL DISPOSABLE) ×4 IMPLANT
TOWEL OR 17X26 10 PK STRL BLUE (TOWEL DISPOSABLE) ×8 IMPLANT
TRAY FOLEY W/METER SILVER 16FR (SET/KITS/TRAYS/PACK) IMPLANT
TUBE CONNECTING 12'X1/4 (SUCTIONS) ×1
TUBE CONNECTING 12X1/4 (SUCTIONS) ×3 IMPLANT
WATER STERILE IRR 1000ML POUR (IV SOLUTION) ×8 IMPLANT
YANKAUER SUCT BULB TIP NO VENT (SUCTIONS) ×4 IMPLANT

## 2017-05-05 NOTE — ED Provider Notes (Signed)
[facility] EMERGENCY DEPARTMENT Provider Note   CSN: [id number] Arrival date & time: 05/05/17  6734     History   Chief Complaint No chief complaint on file.   HPI Leah Whitaker is a 27 y.o. female who presents the emergency department chief complaint of left ankle fracture.  Patient states she was ambulating in her home with HER-47-year-old on her hip and lost her footing.  She had immediate severe pain and heard a snap in her ankle.  She noticed that it was disarticulated in the mid shaft of her lower left leg.  She also has bleeding from a wound from the fracture site.  Patient states that she thinks she is up-to-date on her tetanus vaccination but is unsure of the last time she had it.  Her pain is 10 out of 10 however she appears to be handling it well.  Patient recently had a medically induced abortion a week and a half ago.  She still has some mild bleeding but denies any shortness of breath or dizziness.  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  HPI  Past Medical History:  Diagnosis Date  . Asthma    last inhaler use "years ago"    Patient Active Problem List   Diagnosis Date Noted  . Indication for care in labor or delivery 11/28/2014  . Vaginal delivery 11/28/2014    Past Surgical History:  Procedure Laterality Date  . WISDOM TOOTH EXTRACTION      OB History    Gravida Para Term Preterm AB Living   1 1 1     1    SAB TAB Ectopic Multiple Live Births         0 1       Home Medications    Prior to Admission medications   Medication Sig Start Date End Date Taking? Authorizing Provider  acetaminophen (TYLENOL) 500 MG tablet Take 500 mg by mouth every 6 (six) hours as needed for moderate pain or headache.     [provider]  albuterol (PROVENTIL HFA;VENTOLIN HFA) 108 (90 BASE) MCG/ACT inhaler Inhale 2 puffs into the lungs every 4 (four) hours as needed for wheezing or shortness of breath.     [provider]    HYDROcodone-acetaminophen (NORCO/VICODIN) 5-325 MG tablet Take 1 tablet by mouth every 4 (four) hours as needed. 01/23/15   Virgel Manifold, MD  Prenatal Vit-Fe Fumarate-FA (PRENATAL MULTIVITAMIN) TABS tablet Take 1 tablet by mouth daily at 12 noon.    [provider]    Family History No family history on file.  Social History Social History   Tobacco Use  . Smoking status: Former Smoker  Substance Use Topics  . Alcohol use: No  . Drug use: Yes    Types: IV, Cocaine     Allergies   Patient has no known allergies.   Review of Systems Review of Systems  Ten systems reviewed and are negative for acute change, except as noted in the HPI.    Physical Exam Updated Vital Signs There were no vitals taken for this visit.  Physical Exam  Constitutional: She is oriented to person, place, and time. She appears well-developed and well-nourished. No distress.  HENT:  Head: Normocephalic and atraumatic.  Eyes: Conjunctivae are normal. No scleral icterus.  Neck: Normal range of motion.  Cardiovascular: Normal rate, regular rhythm and normal heart sounds. Exam reveals no gallop and no friction rub.  No murmur heard. Pulmonary/Chest: Effort normal and  breath sounds normal. No respiratory distress.  Abdominal: Soft. Bowel sounds are normal. She exhibits no distension and no mass. There is no tenderness. There is no guarding.  Musculoskeletal:  Left ankle with noted deformity.  There is no attachment of the distal ankle to the tibia and fibula.  Open site on the medial side of the lower extremity with mild bleeding.  DP and PT pulses intact, normal sensation.  Neurological: She is alert and oriented to person, place, and time.  Skin: Skin is warm and dry. She is not diaphoretic.  Psychiatric: Her behavior is normal.  Nursing note and vitals reviewed.    ED Treatments / Results  Labs (all labs ordered are listed, but only abnormal results are displayed) Labs Reviewed - No  data to display  EKG  EKG Interpretation None       Radiology No results found.  Procedures Procedures (including critical care time)  Medications Ordered in ED Medications - No data to display   Initial Impression / Assessment and Plan / ED Course  I have reviewed the triage vital signs and the nursing notes.  Pertinent labs & imaging results that were available during my care of the patient were reviewed by me and considered in my medical decision making (see chart for details).  Clinical Course as of May 05 1104  Fri May 05, 2017  1102 DG Tibia/Fibula Left Port [AH]  1104 I spoke with Dr. Kennon Rounds who states that HCG is consistent with patient's Hx. I-stat hCG, quantitative: (!) 208.0 [AH]    Clinical Course User Index [AH] Margarita Mail, PA-C    Patient with open fracture of the left lower extremity.  Patient will go to the operating room at 1:30 PM today.  We have had difficulty accessing the patient and I will attempt an ultrasound-guided IV.  Did speak with Dr. Kennon Rounds about the patient's hCG and she states that it is consistent with her previous medically induced abortion.  Tetanus vaccination updated, patient got 2 g of IV Ancef for open fracture prophylaxis. Patient taken to the OR emergently for ORIF of her open ankle fracture. Final Clinical Impressions(s) / ED Diagnoses   Final diagnoses:  Open ankle fracture    ED Discharge Orders    None       Margarita Mail, PA-C 05/05/17 1547    Macarthur Critchley, MD 05/05/17 1950

## 2017-05-05 NOTE — ED Notes (Signed)
Short stay RN aware of difficulty obtaining IV access as well as blood draw. Minimal blood obtained earlier has clotted, per lab. Per short stay RN, lab upstairs will attempt to get blood work and per [employer], anesthesia will obtain IV access.

## 2017-05-05 NOTE — Anesthesia Procedure Notes (Addendum)
Procedure Name: Intubation Date/Time: 05/05/2017 12:45 PM Performed by: Candis Shine, CRNA Pre-anesthesia Checklist: Patient identified, Emergency Drugs available, Suction available and Patient being monitored Patient Re-evaluated:Patient Re-evaluated prior to induction Oxygen Delivery Method: Circle System Utilized Preoxygenation: Pre-oxygenation with 100% oxygen Induction Type: IV induction Ventilation: Mask ventilation without difficulty Laryngoscope Size: Mac and 4 Grade View: Grade I Tube type: Oral Tube size: 7.0 mm Number of attempts: 1 Airway Equipment and Method: Stylet and Oral airway Placement Confirmation: ETT inserted through vocal cords under direct vision,  positive ETCO2 and breath sounds checked- equal and bilateral Secured at: 23 cm Tube secured with: Tape Dental Injury: Teeth and Oropharynx as per pre-operative assessment

## 2017-05-05 NOTE — Op Note (Addendum)
OrthopaedicSurgeryOperativeNote (ZOX:[id number]:[id number]) Date of Surgery: 05/05/2017  Admit Date: 05/05/2017   Diagnoses: Pre-Op Diagnoses: Left type 1 open tibia fracture Left pilon fracture Left closed fibular fracture  Post-Op Diagnosis: Same  Procedures: 1. CPT 11012-I&D of left open tibia fracture 2. CPT 27827-ORIF of left pilon fracture (tibia only) 3. CPT 27759-Intramedullary nailing of left tibia fracture 4. CPT 27781-Closed treatment of left fibular shaft fracture  Surgeons: Primary: Haddix, Gillie MannersKevin P, MD   Assistant: Montez MoritaKeith Paul, PA-C  Location:[facility] OR ROOM 09   AnesthesiaGeneral   Antibiotics:Ancef 2g preop   Tourniquettime:None  EstimatedBloodLoss:50 mL   Complications:None  Specimens:None  Implants: Implant Name Type Inv. Item Serial No. Manufacturer Lot No. LRB No. Used Action  NAIL TIBIAL EX [id number] - SN/A Nail NAIL TIBIAL EX 9X345 N/A [employer] [id number] Left 1 Implanted  SCREW LOCKING 4X32 - SN/A Screw SCREW LOCKING 4X32 N/A [employer] N/A Left 2 Implanted  SCREW LOCK 4X44 TI - SN/A Screw SCREW LOCK 4X44 TI N/A [employer] N/A Left 1 Implanted  SCREW LOCKING 4X38 - SN/A Screw SCREW LOCKING 4X38 N/A [employer] N/A Left 1 Implanted  SCREW LOCKING 4.0 28MM - SN/A Screw SCREW LOCKING 4.0 28MM N/A [employer] N/A Left 1 Implanted  SCREW LOCKING 4.0 34MM - SN/A Screw SCREW LOCKING 4.0 34MM N/A [employer] N/A Left 1 Implanted    IndicationsforSurgery: This is a 27 year old female who tripped and fell while carrying her 25-year-old he sustained an open tibial shaft fracture.  There was significant comminution and involve the ankle as well.  A CT scan was obtained which showed involvement of the anterior and posterior plafond.  I felt that indicated for urgent I&D with likely percutaneous fixation of her plafond fracture with intramedullary nailing of her tibia fracture.  Risks discussed included bleeding requiring blood transfusion, bleeding  causing a hematoma, infection, malunion, nonunion, damage to surrounding nerves and blood vessels, pain, hardware prominence or irritation, hardware failure, stiffness, post-traumatic arthritis, DVT/PE, compartment syndrome, and even death.Risks and benefits were extensively discussed as noted above and the patient agreed to proceed with surgery and consent was obtained.  Operative Findings: 1.  Type I open left tibial shaft fracture and comminution status post I&D 2.  Mini open reduction and internal fixation of left tibial plafond fracture 3.5 mm lag screws from anterior to posterior 3.  Intramedullary nailing of left tibia fracture S[employer] EX nail 9 x 345 mm with 3 distal interlocks and 2 proximal interlocks. 4.  Closed treatment of left fibular shaft fracture.  Procedure: The patient was identified in the preoperative holding area. Consent was confirmed with the patient and all questions were answered. The operative extremity was marked after confirmation with the patient. They were then brought back to the operating room by our anesthesia colleagues.  The patient was placed under general anesthetic and then carefully transferred over to a radiolucent flat top table.  Here a bump was placed under the operative hip and a bone foam was placed under the operative extremity.  The operative extremity was then prepped and draped in usual sterile fashion. A preoperative timeout was performed to verify the patient, the procedure, and the extremity. Preoperative antibiotics were dosed.  Fluoroscopic imaging was obtained to show the displacement and instability of the fracture.  There was a small puncture wound on the medial side of her lower leg.  This was extended into a approximately 7 cm incision.  I carried the incision down through skin and subcutaneous tissue to the  open fracture site.  I then debrided using scissors and a ronguer the subcutaneous fat and periosteum surrounding the fracture.  And then  debrided the edge of the bone that had punctured through the skin.  I then used low pressure pulsatile lavage to thoroughly irrigate the bone and open fracture wound with 3 L of normal saline.  I then proceeded to changed instruments and gloves and turned my attention to the tibial plafond fracture.  There was a fracture of the lateral tibial plafond with a small amount of impaction in the central portion.  There is a relatively large posterior malleolus fracture.  I made a small percutaneous incision using fluoroscopy as a guide over the anterior lateral surface of the distal tibia.  I also made a small percutaneous incision along the posterior lateral aspect of the leg to place a tine of a clamp on the posterior malleolus.  Using fluoroscopy and traction and a 399 Weber clamp was able to compress and partially reduce the impacted central portion of the lateral tibial plafond.  There was a small residual step-off of less than 1 mm which I felt was acceptable considering I would have to make a large open reduction to fully attempt to reduce this.  I then used fluoroscopy as a guide to place a 3.5 mm lag screw from anterior to posterior to gain fixation of the plafond segment.  I removed my clamp and then placed another positional 3.5 millimeter screw just lateral to the first screw.  I then turned my attention to the tibial nailing portion of the procedure.   I made a lateral parapatellar incision carried down through skin and subcutaneous tissue just lateral to the patellar tendon.  I released a portion of the lateral retinaculum but stayed extra-articular outside the capsule.  I excised the anterior fat pad and then proceeded to place a guidepin under fluoroscopic imaging to confirm adequate placement in both the AP and lateral views.  I then advanced a wire into the proximal metaphysis of the tibia.  I then used an entry reamer to enter the canal.  I passed a bent ball-tipped guidewire down the center of  the canal and was able to pass in the distal segment after performing a reduction maneuver. During this reduction maneuver the fibula was also manipulated into reduction. I seated the ball tip guidewire down into the physeal scar.  I then measured the length of the nail and I chose a 345 mm nail.  I then proceeded to sequentially ream up from 8.5 mm to 10 mm.  I only across the distal segment into the distal portion of the metaphysis using a 9 mm reamer. I obtained excellent chatter at 10 mm through the isthmus and I chose to place an 9 mm nail.  I seated the nail across the fracture into the distal metaphysis while holding the reduction.  Fracture was relatively stable and the alignment was appropriate in both the AP and lateral planes.  I then placed 2 distal interlocking screws from medial to lateral using perfect circle technique.  I placed an oblique medial to lateral and anterior-posterior screw.  I had excellent fixation of the distal segment.  I felt that while placing the distal interlocking screws the distal segment had externally rotated with slight amount.  I was able to get a decent cortical read on the medial side and internally rotated the distal segment to approximate her anatomic position.  I then used my proximal jig to place  2 proximal interlocking screws through percutaneous incisions.  I removed the jig and obtained final fluoroscopic imaging.  The incisions were copiously irrigated.  I closed the lateral parapatellar incision with 0 Vicryl, 2-0 Monocryl and 3-0 nylon.  A gram of vancomycin powder was placed between the lateral parapatellar incision and through the traumatic wound.  The traumatic wound was closed with 2-0 Monocryl and 3-0 nylon.  A sterile dressing consisting of bacitracin ointment, Adaptic, 4 x 4's and sterile cast padding was placed over the incisions.  A well-padded short leg splint was then placed to her lower extremity. The patient was then awoken from anesthesia and  taken to the PACU in stable condition.  Post Op Plan/Instructions: The patient will be nonweightbearing to the left lower extremity.  She received 24 hours of postoperative antibiotics.  She will be started on aspirin for DVT prophylaxis.  She will mobilize with physical therapy.  She will likely be discharged home on postoperative day 1.  I was present and performed the entire surgery.  Montez Morita, PA-C did assist me throughout the case. An assistant was necessary given the difficulty in approach, maintenance of reduction and ability to instrument the fracture.   Truitt Merle, MD [facility]

## 2017-05-05 NOTE — Transfer of Care (Signed)
Immediate Anesthesia Transfer of Care Note  Patient: Leah NordmannMeredith A Whitaker  Procedure(s) Performed: LEFT INTRAMEDULLARY (IM) TIBIAL NAIL (Left Leg Lower)  Patient Location: PACU  Anesthesia Type:General  Level of Consciousness: awake, alert  and oriented  Airway & Oxygen Therapy: Patient Spontanous Breathing and Patient connected to face mask oxygen  Post-op Assessment: Report given to RN and Post -op Vital signs reviewed and stable  Post vital signs: Reviewed and stable  Last Vitals:  Vitals:   05/05/17 1100 05/05/17 1137  BP:  110/65  Pulse: 70 70  Resp:  18  SpO2: 100% 100%    Last Pain: There were no vitals filed for this visit.       Complications: No apparent anesthesia complications

## 2017-05-05 NOTE — Progress Notes (Signed)
Orthopedic Tech Progress Note Patient Details:  Leah NordmannMeredith A Whitaker 06/03/1990 [id number]  Ortho Devices Type of Ortho Device: Ace wrap, Short leg splint Ortho Device/Splint Interventions: Application   Post Interventions Patient Tolerated: Well Instructions Provided: Care of device   [name] 05/05/2017, 11:30 AM

## 2017-05-05 NOTE — Anesthesia Postprocedure Evaluation (Signed)
Anesthesia Post Note  Patient: Leah NordmannMeredith A Skufca  Procedure(s) Performed: LEFT INTRAMEDULLARY (IM) TIBIAL NAIL (Left Leg Lower)     Patient location during evaluation: PACU Anesthesia Type: General Level of consciousness: awake and alert Pain management: pain level controlled Vital Signs Assessment: post-procedure vital signs reviewed and stable Respiratory status: spontaneous breathing, nonlabored ventilation, respiratory function stable and patient connected to nasal cannula oxygen Cardiovascular status: blood pressure returned to baseline and stable Postop Assessment: no apparent nausea or vomiting Anesthetic complications: no    Last Vitals:  Vitals:   05/05/17 1625 05/05/17 1639  BP:  116/66  Pulse:  62  Resp:  16  Temp: 36.6 C 36.8 C  SpO2:  100%    Last Pain:  Vitals:   05/05/17 1703  TempSrc:   PainSc: 8                  Kennieth RadFitzgerald, Jazae Gandolfi E

## 2017-05-05 NOTE — Anesthesia Preprocedure Evaluation (Addendum)
Anesthesia Evaluation  Patient identified by MRN, date of birth, ID band Patient awake    Reviewed: Allergy & Precautions, NPO status , Patient's Chart, lab work & pertinent test results  Airway Mallampati: II  TM Distance: >3 FB Neck ROM: Full    Dental  (+) Dental Advisory Given   Pulmonary asthma , former smoker,    breath sounds clear to auscultation       Cardiovascular negative cardio ROS   Rhythm:Regular Rate:Normal     Neuro/Psych negative neurological ROS     GI/Hepatic negative GI ROS, (+)     substance abuse (recovering addict)  cocaine use,   Endo/Other  negative endocrine ROS  Renal/GU negative Renal ROS     Musculoskeletal   Abdominal   Peds  Hematology negative hematology ROS (+)   Anesthesia Other Findings   Reproductive/Obstetrics Elevated b-hcg. Pt had abortion 2 weeks ago per her OBGYN levels reflect that finding.                            Lab Results  Component Value Date   WBC 11.4 (H) 11/29/2014   HGB 9.1 (L) 11/29/2014   HCT 28.0 (L) 11/29/2014   MCV 79.3 11/29/2014   PLT 170 11/29/2014   Lab Results  Component Value Date   CREATININE 0.81 02/05/2014   BUN 11 02/05/2014   NA 140 02/05/2014   K 3.1 (L) 02/05/2014   CL 100 02/05/2014   CO2 20 02/05/2014    Anesthesia Physical Anesthesia Plan  ASA: II  Anesthesia Plan: General   Post-op Pain Management:    Induction: Intravenous  PONV Risk Score and Plan: 3 and Ondansetron, Dexamethasone, Midazolam and Treatment may vary due to age or medical condition  Airway Management Planned: Oral ETT  Additional Equipment:   Intra-op Plan:   Post-operative Plan: Extubation in OR  Informed Consent: I have reviewed the patients History and Physical, chart, labs and discussed the procedure including the risks, benefits and alternatives for the proposed anesthesia with the patient or authorized  representative who has indicated his/her understanding and acceptance.   Dental advisory given  Plan Discussed with: CRNA  Anesthesia Plan Comments:         Anesthesia Quick Evaluation

## 2017-05-05 NOTE — Consult Note (Signed)
Reason for Consult:Open tib/fib fx Referring Physician: C MacKuen  Leah NordmannMeredith A Whitaker is an 27 y.o. female.  HPI: Leah NimrodMeredith was at home with her 27yo and tripped over a boot. She had immediate pain in her leg and was unable to ambulate. She came to the ED for evaluation and x-rays showed a left tib/fib fx that is open and orthopedic surgery was consulted. Her HCG is elevated but she underwent a medical AB 2 weeks ago. She works as a Engineering geologistday care worker.  Past Medical History:  Diagnosis Date  . Asthma    last inhaler use "years ago"    Past Surgical History:  Procedure Laterality Date  . WISDOM TOOTH EXTRACTION      No family history on file.  Social History:  reports that she smokes 1/4 pack/day. She does not have any smokeless tobacco history on file. 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 She reports that she does not drink alcohol.  Allergies:  Allergies  Allergen Reactions  . Seroquel [Quetiapine]     BODY CONVULSIONS    Medications: I have reviewed the patient's current medications.  Results for orders placed or performed during the hospital encounter of 05/05/17 (from the past 48 hour(s))  I-Stat beta hCG blood, ED     Status: Abnormal   Collection Time: 05/05/17 10:24 AM  Result Value Ref Range   I-stat hCG, quantitative 208.0 (H) <5 mIU/mL   Comment 3            Comment:   GEST. AGE      CONC.  (mIU/mL)   <=1 WEEK        5 - 50     2 WEEKS       50 - 500     3 WEEKS       100 - 10,000     4 WEEKS     1,000 - 30,000        FEMALE AND NON-PREGNANT FEMALE:     LESS THAN 5 mIU/mL     Dg Tibia/fibula Left Port  Result Date: 05/05/2017 CLINICAL DATA:  Pt was carrying her child and fell this morning over a pile of stuff by the door. Distal tib/fib pain. Obvious deformity. EXAM: PORTABLE LEFT TIBIA AND FIBULA - 2 VIEW COMPARISON:  None. FINDINGS: Displaced/comminuted fracture of the distal left tibia, distal metadiaphyseal region. Additional displaced  fracture of the distal left fibula, at same level. Ankle mortise remains symmetric. Proximal tibia and fibula appear intact and normally aligned. Soft tissue swelling/edema. IMPRESSION: 1. Displaced/comminuted fracture of the distal left tibia, centered within the distal metadiaphysis, with associated angulation deformity and probable overriding of the main fracture fragments. It is unclear if this is an open fracture. 2. Displaced fracture of the distal left fibula, also distal metadiaphysis, with approximately 10 mm lateral displacement of the distal fracture fragment. Electronically Signed   By: Bary RichardStan  Maynard M.D.   On: 05/05/2017 10:45    Review of Systems  Constitutional: Negative for weight loss.  HENT: Negative for ear discharge, ear pain, hearing loss and tinnitus.   Eyes: Negative for blurred vision, double vision, photophobia and pain.  Respiratory: Negative for cough, sputum production and shortness of breath.   Cardiovascular: Negative for chest pain.  Gastrointestinal: Negative for abdominal pain, nausea and vomiting.  Genitourinary: Negative for dysuria, flank pain, frequency and urgency.  Musculoskeletal: Positive for joint pain (left leg/ankle). Negative for back pain, falls, myalgias and neck pain.  Neurological: Negative for dizziness, tingling, sensory change, focal weakness, loss of consciousness and headaches.  Endo/Heme/Allergies: Does not bruise/bleed easily.  Psychiatric/Behavioral: Negative for depression, memory loss and substance abuse. The patient is not nervous/anxious.    currently breastfeeding. Physical Exam  Constitutional: She appears well-developed and well-nourished. No distress.  HENT:  Head: Normocephalic and atraumatic.  Eyes: Conjunctivae are normal. Right eye exhibits no discharge. Left eye exhibits no discharge. No scleral icterus.  Neck: Normal range of motion.  Cardiovascular: Normal rate and regular rhythm.  Respiratory: Effort normal. No  respiratory distress.  Musculoskeletal:  LLE Punctate medial lower leg wound, no ecchymosis or rash  TTP  No knee or ankle effusion  Knee stable to varus/ valgus and anterior/posterior stress  Sens DPN, SPN, TN intact  Motor EHL, ext, flex, evers 5/5  DP 2+, PT 1+, No significant edema  Neurological: She is alert.  Skin: Skin is warm and dry. She is not diaphoretic.  Psychiatric: She has a normal mood and affect. Her behavior is normal.    Assessment/Plan: Fall Left open tib/fib fx -- For ORIF today by Dr. Jena Gauss. Please keep NPO. Elevated HCG -- Consistent with medical AB 2 weeks ago Tobacco use -- Encouraged cessation to optimize healing    Freeman Caldron, PA-C Orthopedic Surgery [phone] 05/05/2017, 10:54 AM

## 2017-05-06 LAB — CBC
HEMATOCRIT: 29.9 % — AB (ref 36.0–46.0)
HEMOGLOBIN: 9.6 g/dL — AB (ref 12.0–15.0)
MCH: 27.1 pg (ref 26.0–34.0)
MCHC: 32.1 g/dL (ref 30.0–36.0)
MCV: 84.5 fL (ref 78.0–100.0)
Platelets: 285 10*3/uL (ref 150–400)
RBC: 3.54 MIL/uL — ABNORMAL LOW (ref 3.87–5.11)
RDW: 13.3 % (ref 11.5–15.5)
WBC: 11.7 10*3/uL — ABNORMAL HIGH (ref 4.0–10.5)

## 2017-05-06 LAB — HIV ANTIBODY (ROUTINE TESTING W REFLEX): HIV SCREEN 4TH GENERATION: NONREACTIVE

## 2017-05-06 MED ORDER — OXYCODONE-ACETAMINOPHEN 7.5-325 MG PO TABS: 1.0000 | ORAL_TABLET | ORAL | 0 refills | Status: DC | PRN

## 2017-05-06 MED ORDER — KETOROLAC TROMETHAMINE 30 MG/ML IJ SOLN
30.0000 mg | Freq: Four times a day (QID) | INTRAMUSCULAR | Status: DC
  Administered 2017-05-06 (×2): 30 mg via INTRAVENOUS
  Filled 2017-05-06 (×2): qty 1

## 2017-05-06 MED ORDER — TRAMADOL HCL 50 MG PO TABS
50.0000 mg | ORAL_TABLET | Freq: Four times a day (QID) | ORAL | Status: DC
  Administered 2017-05-06 (×2): 50 mg via ORAL
  Filled 2017-05-06 (×2): qty 1

## 2017-05-06 MED ORDER — ASPIRIN EC 81 MG PO TBEC: 81.0000 mg | DELAYED_RELEASE_TABLET | Freq: Every day | ORAL | 0 refills | Status: AC

## 2017-05-06 MED ORDER — TRAMADOL HCL 50 MG PO TABS: 50.0000 mg | ORAL_TABLET | Freq: Four times a day (QID) | ORAL | 0 refills | Status: DC | PRN

## 2017-05-06 MED ORDER — METHOCARBAMOL 750 MG PO TABS: 750.0000 mg | ORAL_TABLET | Freq: Four times a day (QID) | ORAL | 0 refills | Status: DC | PRN

## 2017-05-06 MED ORDER — OXYCODONE-ACETAMINOPHEN 7.5-325 MG PO TABS
2.0000 | ORAL_TABLET | ORAL | Status: DC | PRN
  Filled 2017-05-06: qty 2

## 2017-05-06 NOTE — Care Management Note (Addendum)
Case Management Note  Patient Details  Name: Leah Whitaker MRN: [id number] Date of Birth: 11/22/1990  Subjective/Objective:      27 year old female s/p ORIF of pilon and IMN of left open tibia fracture.                      Action/Plan: Patient has no need for Home Health, DME has been requested. Will have assistance at discharge.    Expected Discharge Date:  05/06/17               Expected Discharge Plan:  Home w Home Health Services  In-House Referral:     Discharge planning Services  CM Consult  Post Acute Care Choice:  Durable Medical Equipment Choice offered to:  NA  DME Arranged:  3-N-1, Walker rolling DME Agency:  [facility]  HH Arranged:  NA HH Agency:  NA  Status of Service:  Completed, signed off  If discussed at Long Length of Stay Meetings, dates discussed:    Additional Comments:  Durenda GuthrieBrady, Adel Neyer Naomi, RN 05/06/2017, 11:11 AM

## 2017-05-06 NOTE — Discharge Instructions (Addendum)
Orthopaedic Trauma Service Discharge Instructions   General Discharge Instructions  WEIGHT BEARING STATUS: Nonweightbearing to left leg  RANGE OF MOTION/ACTIVITY: Stay in splint until follow up appointment  Wound Care: Keep splint in place until follow up appointment  DVT/PE prophylaxis: Take a 81 mg aspirin for 1 month to prevent blood clots  Diet: as you were eating previously.  Can use over the counter stool softeners and bowel preparations, such as Miralax, to help with bowel movements.  Narcotics can be constipating.  Be sure to drink plenty of fluids  PAIN MEDICATION USE AND EXPECTATIONS  You have likely been given narcotic medications to help control your pain.  After a traumatic event that results in an fracture (broken bone) with or without surgery, it is ok to use narcotic pain medications to help control one's pain.  We understand that everyone responds to pain differently and each individual patient will be evaluated on a regular basis for the continued need for narcotic medications. Ideally, narcotic medication use should last no more than 6-8 weeks (coinciding with fracture healing).   As a patient it is your responsibility as well to monitor narcotic medication use and report the amount and frequency you use these medications when you come to your office visit.   We would also advise that if you are using narcotic medications, you should take a dose prior to therapy to maximize you participation.  IF YOU ARE ON NARCOTIC MEDICATIONS IT IS NOT PERMISSIBLE TO OPERATE A MOTOR VEHICLE (MOTORCYCLE/CAR/TRUCK/MOPED) OR HEAVY MACHINERY DO NOT MIX NARCOTICS WITH OTHER CNS (CENTRAL NERVOUS SYSTEM) DEPRESSANTS SUCH AS ALCOHOL   STOP SMOKING OR USING NICOTINE PRODUCTS!!!!  As discussed nicotine severely impairs your body's ability to heal surgical and traumatic wounds but also impairs bone healing.  Wounds and bone heal by forming microscopic blood vessels (angiogenesis) and nicotine is a  vasoconstrictor (essentially, shrinks blood vessels).  Therefore, if vasoconstriction occurs to these microscopic blood vessels they essentially disappear and are unable to deliver necessary nutrients to the healing tissue.  This is one modifiable factor that you can do to dramatically increase your chances of healing your injury.    (This means no smoking, no nicotine gum, patches, etc)  DO NOT USE NONSTEROIDAL ANTI-INFLAMMATORY DRUGS (NSAID'S)  Using products such as Advil (ibuprofen), Aleve (naproxen), Motrin (ibuprofen) for additional pain control during fracture healing can delay and/or prevent the healing response.  If you would like to take over the counter (OTC) medication, Tylenol (acetaminophen) is ok.  However, some narcotic medications that are given for pain control contain acetaminophen as well. Therefore, you should not exceed more than 4000 mg of tylenol in a day if you do not have liver disease.  Also note that there are may OTC medicines, such as cold medicines and allergy medicines that my contain tylenol as well.  If you have any questions about medications and/or interactions please ask your doctor/PA or your pharmacist.      ICE AND ELEVATE INJURED/OPERATIVE EXTREMITY  Using ice and elevating the injured extremity above your heart can help with swelling and pain control.  Icing in a pulsatile fashion, such as 20 minutes on and 20 minutes off, can be followed.    Do not place ice directly on skin. Make sure there is a barrier between to skin and the ice pack.    Using frozen items such as frozen peas works well as the conform nicely to the are that needs to be iced.  USE AN  ACE WRAP OR TED HOSE FOR SWELLING CONTROL  In addition to icing and elevation, Ace wraps or TED hose are used to help limit and resolve swelling.  It is recommended to use Ace wraps or TED hose until you are informed to stop.    When using Ace Wraps start the wrapping distally (farthest away from the body) and  wrap proximally (closer to the body)   Example: If you had surgery on your leg or thing and you do not have a splint on, start the ace wrap at the toes and work your way up to the thigh        If you had surgery on your upper extremity and do not have a splint on, start the ace wrap at your fingers and work your way up to the upper arm  IF YOU ARE IN A SPLINT OR CAST DO NOT REMOVE IT FOR ANY REASON   If your splint gets wet for any reason please contact the office immediately. You may shower in your splint or cast as long as you keep it dry.  This can be done by wrapping in a cast cover or garbage back (or similar)  Do Not stick any thing down your splint or cast such as pencils, money, or hangers to try and scratch yourself with.  If you feel itchy take benadryl as prescribed on the bottle for itching  IF YOU ARE IN A CAM BOOT (BLACK BOOT)  You may remove boot periodically. Perform daily dressing changes as noted below.  Wash the liner of the boot regularly and wear a sock when wearing the boot. It is recommended that you sleep in the boot until told otherwise  CALL THE OFFICE WITH ANY QUESTIONS OR CONCERNS: [phone]

## 2017-05-06 NOTE — Discharge Summary (Signed)
Orthopaedic Trauma Service (OTS)  Patient ID: Leah NordmannMeredith A Paez MRN: [id number] DOB/AGE: 25/11/1990 26 y.o.  Admit date: 05/05/2017 Discharge date: 05/06/2017  Admission Diagnoses:Tibia/fibula fracture, left, open type I or II, initial encounter  Discharge Diagnoses:  Active Problems:   Tibia/fibula fracture, left, open type I or II, initial encounter   Past Medical History:  Diagnosis Date  . Asthma    last inhaler use "years ago"     Procedures Performed: 05/05/2017: 1. CPT 11012-I&D of left open tibia fracture 2. CPT 27827-ORIF of left pilon fracture (tibia only) 3. CPT 27759-Intramedullary nailing of left tibia fracture 4. CPT 27781-Closed treatment of left fibular shaft fracture  Discharged Condition: good  Hospital Course: Patient was admitted and taken to emergent surgery for the above surgery. She was admitted postoperatively for IV antibiotics and therapy. She cleared therapy, her pain was well controlled with oral medications and she was tolerating a regular diet. She was discharged postoperative day 1.  Consults: None  Significant Diagnostic Studies: None  Treatments: surgery: As above  Discharge Exam: Vitals:   05/06/17 0531 05/06/17 0754  BP: 115/67   Pulse: 86 80  Resp:  16  Temp: 98.3 F (36.8 C) 98.2 F (36.8 C)  SpO2: 98% 98%   LLE: Splint clean, dry and intact. Compartments soft and compressible. Endorses sensation to SPN and DPN. Wiggles toes     Disposition: 01-Home or Self Care   Allergies as of 05/06/2017      Reactions   Seroquel [quetiapine]    BODY CONVULSIONS      Medication List    STOP taking these medications   acetaminophen 500 MG tablet Commonly known as:  TYLENOL   HYDROcodone-acetaminophen 5-325 MG tablet Commonly known as:  NORCO/VICODIN   ibuprofen 600 MG tablet Commonly known as:  ADVIL,MOTRIN     TAKE these medications   albuterol 108 (90 Base) MCG/ACT inhaler Commonly known as:  PROVENTIL HFA;VENTOLIN  HFA Inhale 2 puffs into the lungs every 4 (four) hours as needed for wheezing or shortness of breath.   aspirin EC 81 MG tablet Take 1 tablet (81 mg total) by mouth daily.   BLISOVI 24 FE 1-20 MG-MCG(24) tablet Generic drug:  Norethindrone Acetate-Ethinyl Estrad-FE Take 1 tablet by mouth daily.   buPROPion 300 MG 24 hr tablet Commonly known as:  WELLBUTRIN XL Take 300 mg by mouth daily.   methocarbamol 750 MG tablet Commonly known as:  ROBAXIN-750 Take 1 tablet (750 mg total) by mouth every 6 (six) hours as needed for muscle spasms.   omeprazole 20 MG capsule Commonly known as:  PRILOSEC Take 20 mg by mouth 2 (two) times daily.   oxyCODONE-acetaminophen 7.5-325 MG tablet Commonly known as:  PERCOCET Take 1-2 tablets by mouth every 4 (four) hours as needed for severe pain.   prenatal multivitamin Tabs tablet Take 1 tablet by mouth daily at 12 noon.   rOPINIRole 1 MG tablet Commonly known as:  REQUIP Take 1 mg by mouth at bedtime as needed.   traMADol 50 MG tablet Commonly known as:  ULTRAM Take 1 tablet (50 mg total) by mouth every 6 (six) hours as needed.   traZODone 100 MG tablet Commonly known as:  DESYREL Take 100 mg by mouth at bedtime as needed.   UNABLE TO FIND Take 1 tablet by mouth once. ABORTION TABLET TAKEN ONCE ABOUT TWO WEEKS AGO 04/21/17        Discharge Instructions and Plan: Return in 1-2 weeks for suture removal. Asprin  for DVT prophylaxis, NWB LLE.  Signed:  Roby Lofts, MD [facility] [phone] (phone) 05/06/2017, 7:01 AM

## 2017-05-06 NOTE — Evaluation (Signed)
Physical Therapy Evaluation Patient Details Name: Leah Whitaker MRN: [id number] DOB: 22-Aug-1990 Today's Date: 05/06/2017   History of Present Illness  27 year old female s/p ORIF of pilon and IMN of left open tibia fracture after fall at home.   Clinical Impression  Patient is s/p above surgery resulting in functional limitations due to the deficits listed below (see PT Problem List). PTA, pt independent working and driving. Pt lives between her won parents and her in-laws homes with stairs and 24 hour support between the two. Upon eval pt presents with moderate post op pain, however able to participate fully in therapy with good spirits. Pt is supervision level for all OOB mobility including stairs at this time with good adherence to NWB status. PT to sign off, all questions and concerns from patient have been answered and she reports confidence in going home once medically cleared.       Follow Up Recommendations No PT follow up;Supervision for mobility/OOB    Equipment Recommendations  Rolling walker with 5" wheels;3in1 (PT)    Recommendations for Other Services       Precautions / Restrictions Precautions Precautions: None Restrictions Weight Bearing Restrictions: Yes LLE Weight Bearing: Non weight bearing      Mobility  Bed Mobility Overal bed mobility: Modified Independent                Transfers Overall transfer level: Modified independent Equipment used: Rolling walker (2 wheeled)             General transfer comment: Cues for hand placement. safe balance  Ambulation/Gait Ambulation/Gait assistance: Supervision Ambulation Distance (Feet): 100 Feet Assistive device: Rolling walker (2 wheeled) Gait Pattern/deviations: Step-to pattern Gait velocity: decreased   General Gait Details: hop to pattern with RW, good adherence to NWB  Stairs Stairs: Yes Stairs assistance: Min assist Stair Management: No rails;Seated/boosting;Backwards;With  walker Number of Stairs: 12 General stair comments: patient trialed RW backwards, can do it but prefers to scoot on her bottom. min A to help limb from contacting floor, patient strong enough to scoot herself up and stand without assistance while adhering to NWB status. no concerns for donig today.   Wheelchair Mobility    Modified Rankin (Stroke Patients Only)       Balance Overall balance assessment: Needs assistance   Sitting balance-Leahy Scale: Normal       Standing balance-Leahy Scale: Fair Standing balance comment: RW support for dynamic balance                             Pertinent Vitals/Pain Pain Assessment: 0-10 Pain Score: 7  Pain Location: L ankle  Pain Descriptors / Indicators: Discomfort;Aching Pain Intervention(s): Limited activity within patient's tolerance;Monitored during session;Premedicated before session;Repositioned    Home Living Family/patient expects to be discharged to:: Private residence Living Arrangements: Parent Available Help at Discharge: Available PRN/intermittently;Family Type of Home: House Home Access: Stairs to enter Entrance Stairs-Rails: Can reach both   Home Layout: Two level Home Equipment: None      Prior Function Level of Independence: Independent(independent working driving )               Hand Dominance   Dominant Hand: Right    Extremity/Trunk Assessment   Upper Extremity Assessment Upper Extremity Assessment: Overall WFL for tasks assessed    Lower Extremity Assessment Lower Extremity Assessment: LLE deficits/detail(RLE 5/5) LLE Deficits / Details: LLE pain consistent with above procedure.  Cervical / Trunk Assessment Cervical / Trunk Assessment: Normal  Communication   Communication: No difficulties  Cognition Arousal/Alertness: Awake/alert Behavior During Therapy: WFL for tasks assessed/performed Overall Cognitive Status: Within Functional Limits for tasks assessed                                         General Comments General comments (skin integrity, edema, etc.): Extensive convo about home safety, smoking ceassation, need for follow up OP ortho down the line.     Exercises General Exercises - Lower Extremity Quad Sets: 10 reps Hip ABduction/ADduction: 10 reps Hip Flexion/Marching: 10 reps   Assessment/Plan    PT Assessment Patent does not need any further PT services  PT Problem List         PT Treatment Interventions      PT Goals (Current goals can be found in the Care Plan section)  Acute Rehab PT Goals Patient Stated Goal: go home PT Goal Formulation: With patient Time For Goal Achievement: 05/13/17 Potential to Achieve Goals: Good    Frequency     Barriers to discharge        Co-evaluation               AM-PAC PT "6 Clicks" Daily Activity  Outcome Measure Difficulty turning over in bed (including adjusting bedclothes, sheets and blankets)?: None Difficulty moving from lying on back to sitting on the side of the bed? : None Difficulty sitting down on and standing up from a chair with arms (e.g., wheelchair, bedside commode, etc,.)?: None Help needed moving to and from a bed to chair (including a wheelchair)?: None Help needed walking in hospital room?: A Little Help needed climbing 3-5 steps with a railing? : A Little 6 Click Score: 22    End of Session Equipment Utilized During Treatment: Gait belt Activity Tolerance: Patient tolerated treatment well Patient left: in chair;with call bell/phone within reach Nurse Communication: Mobility status PT Visit Diagnosis: Unsteadiness on feet (R26.81);Muscle weakness (generalized) (M62.81);Pain;Difficulty in walking, not elsewhere classified (R26.2) Pain - Right/Left: Left Pain - part of body: Ankle and joints of foot;Leg    Time: 0920-1015 PT Time Calculation (min) (ACUTE ONLY): 55 min   Charges:   PT Evaluation $PT Eval Low Complexity: 1 Low PT  Treatments $Gait Training: 23-37 mins $Self Care/Home Management: 8-22   PT G Codes:        Etta GrandchildSean Tonya Carlile, PT, DPT Acute Rehab Services Pager: [phone]    Etta GrandchildSean  Loman Logan 05/06/2017, 10:28 AM

## 2017-05-06 NOTE — Plan of Care (Signed)
  Completed/Met Education: Knowledge of General Education information will improve 05/06/2017 1248 - Completed/Met by Eldridge Dace, RN Health Behavior/Discharge Planning: Ability to manage health-related needs will improve 05/06/2017 1248 - Completed/Met by Eldridge Dace, RN Clinical Measurements: Ability to maintain clinical measurements within normal limits will improve 05/06/2017 1248 - Completed/Met by Eldridge Dace, RN Will remain free from infection 05/06/2017 1248 - Completed/Met by Eldridge Dace, RN Diagnostic test results will improve 05/06/2017 1248 - Completed/Met by Eldridge Dace, RN Respiratory complications will improve 05/06/2017 1248 - Completed/Met by Eldridge Dace, RN Cardiovascular complication will be avoided 05/06/2017 1248 - Completed/Met by Eldridge Dace, RN Activity: Risk for activity intolerance will decrease 05/06/2017 1248 - Completed/Met by Eldridge Dace, RN Nutrition: Adequate nutrition will be maintained 05/06/2017 1248 - Completed/Met by Eldridge Dace, RN Coping: Level of anxiety will decrease 05/06/2017 1248 - Completed/Met by Eldridge Dace, RN Elimination: Will not experience complications related to bowel motility 05/06/2017 1248 - Completed/Met by Eldridge Dace, RN Will not experience complications related to urinary retention 05/06/2017 1248 - Completed/Met by Eldridge Dace, RN Pain Managment: General experience of comfort will improve 05/06/2017 1248 - Completed/Met by Eldridge Dace, RN Safety: Ability to remain free from injury will improve 05/06/2017 1248 - Completed/Met by Eldridge Dace, RN Skin Integrity: Risk for impaired skin integrity will decrease 05/06/2017 1248 - Completed/Met by Eldridge Dace, RN Education: Knowledge of the prescribed therapeutic regimen will improve 05/06/2017 1248 - Completed/Met by Eldridge Dace, RN Activity: Ability to increase mobility will improve 05/06/2017 1248 - Completed/Met by  Eldridge Dace, RN Physical Regulation: Postoperative complications will be avoided or minimized 05/06/2017 1248 - Completed/Met by Eldridge Dace, RN Pain Management: Pain level will decrease with appropriate interventions 05/06/2017 1248 - Completed/Met by Eldridge Dace, RN Skin Integrity: Signs of wound healing will improve 05/06/2017 1248 - Completed/Met by Eldridge Dace, RN

## 2017-05-06 NOTE — Progress Notes (Signed)
Orthopaedic Trauma Progress Note  S: In a lot of pain. Feels like her foot in burning.  O:  Vitals:   05/06/17 0531 05/06/17 0754  BP: 115/67   Pulse: 86 80  Resp:  16  Temp: 98.3 F (36.8 C) 98.2 F (36.8 C)  SpO2: 98% 98%   LLE: Splint clean, dry and intact. Compartments soft and compressible. Endorses sensation to SPN and DPN. Wiggles toes  Labs:  CBC    Component Value Date/Time   WBC 11.7 (H) 05/06/2017 0425   RBC 3.54 (L) 05/06/2017 0425   HGB 9.6 (L) 05/06/2017 0425   HCT 29.9 (L) 05/06/2017 0425   PLT 285 05/06/2017 0425   MCV 84.5 05/06/2017 0425   MCH 27.1 05/06/2017 0425   MCHC 32.1 05/06/2017 0425   RDW 13.3 05/06/2017 0425   LYMPHSABS 3.6 02/05/2014 0312   MONOABS 1.1 (H) 02/05/2014 0312   EOSABS 0.1 02/05/2014 0312   BASOSABS 0.0 02/05/2014 03142    A/P: 27 year old female s/p ORIF of pilon and IMN of left open tibia fracture  -NWB LLE -PT today -Pain control, started toradol and tramadol -D/C home later today if pain controlled and clears PT.  Roby LoftsKevin P. Dollene Mallery, MD [facility] [phone] (phone)

## 2017-05-08 ENCOUNTER — Encounter (HOSPITAL_COMMUNITY): Payer: Self-pay | Admitting: Student

## 2017-10-24 ENCOUNTER — Other Ambulatory Visit: Payer: Self-pay | Admitting: Student

## 2017-10-24 DIAGNOSIS — L02416 Cutaneous abscess of left lower limb: Secondary | ICD-10-CM

## 2017-10-24 DIAGNOSIS — S82202B Unspecified fracture of shaft of left tibia, initial encounter for open fracture type I or II: Secondary | ICD-10-CM

## 2017-10-24 DIAGNOSIS — S82402B Unspecified fracture of shaft of left fibula, initial encounter for open fracture type I or II: Secondary | ICD-10-CM

## 2017-10-25 ENCOUNTER — Ambulatory Visit
Admission: RE | Admit: 2017-10-25 | Discharge: 2017-10-25 | Disposition: A | Discharge status: Home / Self Care | Payer: 59 | Source: Ambulatory Visit | Attending: Student | Admitting: Student

## 2017-10-25 ENCOUNTER — Encounter: Payer: Self-pay | Admitting: Radiology

## 2017-10-25 DIAGNOSIS — L02416 Cutaneous abscess of left lower limb: Secondary | ICD-10-CM

## 2017-10-25 DIAGNOSIS — S82202B Unspecified fracture of shaft of left tibia, initial encounter for open fracture type I or II: Secondary | ICD-10-CM

## 2017-10-25 DIAGNOSIS — S82402B Unspecified fracture of shaft of left fibula, initial encounter for open fracture type I or II: Secondary | ICD-10-CM

## 2017-10-25 MED ORDER — IOPAMIDOL (ISOVUE-300) INJECTION 61%
80.0000 mL | Freq: Once | INTRAVENOUS | Status: AC | PRN
  Administered 2017-10-25: 80 mL via INTRAVENOUS

## 2017-10-26 ENCOUNTER — Ambulatory Visit: Payer: Self-pay | Admitting: Student

## 2017-10-26 ENCOUNTER — Encounter (HOSPITAL_COMMUNITY): Payer: Self-pay | Admitting: *Deleted

## 2017-10-26 ENCOUNTER — Other Ambulatory Visit: Payer: Self-pay

## 2017-10-26 DIAGNOSIS — L02416 Cutaneous abscess of left lower limb: Secondary | ICD-10-CM

## 2017-10-26 NOTE — H&P (Signed)
[facility] ([facility]) H&P  Patient ID: Leah Whitaker MRN: [id number] DOB/AGE: 08/07/1990 27 y.o.  Reason for Surgery: Left lower extremity abscess  HPI: Leah Whitaker is an 27 y.o. female who is presenting for surgery for a lower extremity abscess.  The patient underwent I&D and intramedullary nailing of a open left tibial shaft fracture that was performed on 05/05/2017.  She had done well and was ambulating well until about a week ago where she developed acute onset pain at her fracture site with a significant swelling and redness.  She presented to clinic earlier this week at which point I obtained a CT scan which showed possible abscess collection.  Since that time I had prescribed her Bactrim which she has started but her symptoms have worsened with increased swelling and pain.  In light of the symptoms and the findings on CT scan I recommended presenting for incision and drainage of abscess with cultures.  The patient notes that she had the dental work on earlier this month but otherwise no recent illnesses.  She also describes no recent injury to that lower extremity.  Past Medical History:  Diagnosis Date  . Asthma    last inhaler use "years ago"    Past Surgical History:  Procedure Laterality Date  . TIBIA IM NAIL INSERTION Left 05/05/2017   Procedure: LEFT INTRAMEDULLARY (IM) TIBIAL NAIL;  Surgeon: Haddix, Kevin P, MD;  Location: [facility] OR;  Service: Orthopedics;  Laterality: Left;  . WISDOM TOOTH EXTRACTION      No family history on file.  Social History:  reports that she has quit smoking. She does not have any smokeless tobacco history on file. She reports that she has current or past drug history. Drugs: IV and Cocaine. She reports that she does not drink alcohol.  Allergies:  Allergies  Allergen Reactions  . Seroquel [Quetiapine]     BODY CONVULSIONS    Medications:  Current Outpatient Medications on File Prior to Visit  Medication Sig Dispense Refill   . albuterol (PROVENTIL HFA;VENTOLIN HFA) 108 (90 BASE) MCG/ACT inhaler Inhale 2 puffs into the lungs every 4 (four) hours as needed for wheezing or shortness of breath.     . BLISOVI 24 FE 1-20 MG-MCG(24) tablet Take 1 tablet by mouth daily.  3  . buPROPion (WELLBUTRIN XL) 300 MG 24 hr tablet Take 300 mg by mouth daily.  1  . methocarbamol (ROBAXIN-750) 750 MG tablet Take 1 tablet (750 mg total) by mouth every 6 (six) hours as needed for muscle spasms. 25 tablet 0  . omeprazole (PRILOSEC) 20 MG capsule Take 20 mg by mouth 2 (two) times daily.    . oxyCODONE-acetaminophen (PERCOCET) 7.5-325 MG tablet Take 1-2 tablets by mouth every 4 (four) hours as needed for severe pain. 40 tablet 0  . Prenatal Vit-Fe Fumarate-FA (PRENATAL MULTIVITAMIN) TABS tablet Take 1 tablet by mouth daily at 12 noon.    . rOPINIRole (REQUIP) 1 MG tablet Take 1 mg by mouth at bedtime as needed.    . traMADol (ULTRAM) 50 MG tablet Take 1 tablet (50 mg total) by mouth every 6 (six) hours as needed. 30 tablet 0  . traZODone (DESYREL) 100 MG tablet Take 100 mg by mouth at bedtime as needed.  1  . UNABLE TO FIND Take 1 tablet by mouth once. ABORTION TABLET TAKEN ONCE ABOUT TWO WEEKS AGO 04/21/17     No current facility-administered medications on file prior to visit.     ROS: Constitutional: No   fever or chills Vision: No changes in vision ENT: No difficulty swallowing CV: No chest pain Pulm: No SOB or wheezing GI: No nausea or vomiting GU: No urgency or inability to hold urine Skin: No poor wound healing Neurologic: No numbness or tingling Psychiatric: No depression or anxiety Heme: No bruising Allergic: No reaction to medications or food   Exam: General: No acute distress Orientation: Awake alert and oriented  Left lower extremity: Reveals a warm swollen area over her tibia fracture that is significantly tender to palpation.  She is unable to bear weight on the leg due to pain.  She endorses sensation in the dorsum  and plantar aspect of her foot.  She has a warm well-perfused foot.   Medical Decision Making: Imaging: CT scan of her left ankle and lower leg shows findings consistent with cellulitis and possible early abscess formation.  The fracture appears to be healed on 3 out of 4 cortices with nearly full consolidation at the posterior aspect of her tibia  Medical history and chart was reviewed  Assessment/Plan: 27-year-old female with a history of type I open tibia fracture status post I&D and intramedullary nailing now with new onset abscess formation in the setting of a healed tibia fracture  The acute onset and recent dental surgery makes me believe that she had seeded her area and developed a abscess.  She had failed oral antibiotics.  It is gotten worse.  Plan to proceed with incision and drainage.  Risks and benefits were discussed with the patient over the phone.  She agrees to proceed with surgery.  The patient will likely be admitted for overnight observation while the cultures returned.   Kevin P. Haddix, MD [facility] [phone] (phone)   

## 2017-10-26 NOTE — Anesthesia Preprocedure Evaluation (Addendum)
Anesthesia Evaluation  Patient identified by MRN, date of birth, ID band Patient awake    Reviewed: Allergy & Precautions, NPO status , Patient's Chart, lab work & pertinent test results  Airway Mallampati: I  TM Distance: >3 FB Neck ROM: Full    Dental no notable dental hx. (+) Teeth Intact, Dental Advisory Given   Pulmonary asthma , Current Smoker,    Pulmonary exam normal breath sounds clear to auscultation       Cardiovascular Normal cardiovascular exam Rhythm:Regular Rate:Normal     Neuro/Psych Anxiety Depression negative neurological ROS     GI/Hepatic negative GI ROS, Neg liver ROS, GERD  ,  Endo/Other  negative endocrine ROS  Renal/GU negative Renal ROS  negative genitourinary   Musculoskeletal negative musculoskeletal ROS (+)   Abdominal   Peds  Hematology negative hematology ROS (+)   Anesthesia Other Findings Left open tib/fib fracture 05/2017  Reproductive/Obstetrics                            Anesthesia Physical Anesthesia Plan  ASA: II  Anesthesia Plan: General   Post-op Pain Management:    Induction: Intravenous  PONV Risk Score and Plan: 2 and Midazolam, Ondansetron and Dexamethasone  Airway Management Planned: LMA  Additional Equipment:   Intra-op Plan:   Post-operative Plan: Extubation in OR  Informed Consent: I have reviewed the patients History and Physical, chart, labs and discussed the procedure including the risks, benefits and alternatives for the proposed anesthesia with the patient or authorized representative who has indicated his/her understanding and acceptance.   Dental advisory given  Plan Discussed with: CRNA  Anesthesia Plan Comments:         Anesthesia Quick Evaluation

## 2017-10-26 NOTE — H&P (View-Only) (Signed)
[facility] (OTS) H&P  Patient ID: Leah NordmannMeredith A Trindade MRN: [id number] DOB/AGE: 25/11/1990 27 y.o.  Reason for Surgery: Left lower extremity abscess  HPI: Leah Whitaker is an 27 y.o. female who is presenting for surgery for a lower extremity abscess.  The patient underwent I&D and intramedullary nailing of a open left tibial shaft fracture that was performed on 05/05/2017.  She had done well and was ambulating well until about a week ago where she developed acute onset pain at her fracture site with a significant swelling and redness.  She presented to clinic earlier this week at which point I obtained a CT scan which showed possible abscess collection.  Since that time I had prescribed her Bactrim which she has started but her symptoms have worsened with increased swelling and pain.  In light of the symptoms and the findings on CT scan I recommended presenting for incision and drainage of abscess with cultures.  The patient notes that she had the dental work on earlier this month but otherwise no recent illnesses.  She also describes no recent injury to that lower extremity.  Past Medical History:  Diagnosis Date  . Asthma    last inhaler use "years ago"    Past Surgical History:  Procedure Laterality Date  . TIBIA IM NAIL INSERTION Left 05/05/2017   Procedure: LEFT INTRAMEDULLARY (IM) TIBIAL NAIL;  Surgeon: Roby LoftsHaddix, Isabella Roemmich P, MD;  Location: [facility] OR;  Service: Orthopedics;  Laterality: Left;  . WISDOM TOOTH EXTRACTION      No family history on file.  Social History:  reports that she has quit smoking. She does not have any smokeless tobacco history on file. She reports that she has current or past drug history. Drugs: IV and Cocaine. She reports that she does not drink alcohol.  Allergies:  Allergies  Allergen Reactions  . Seroquel [Quetiapine]     BODY CONVULSIONS    Medications:  Current Outpatient Medications on File Prior to Visit  Medication Sig Dispense Refill   . albuterol (PROVENTIL HFA;VENTOLIN HFA) 108 (90 BASE) MCG/ACT inhaler Inhale 2 puffs into the lungs every 4 (four) hours as needed for wheezing or shortness of breath.     Marland Kitchen. BLISOVI 24 FE 1-20 MG-MCG(24) tablet Take 1 tablet by mouth daily.  3  . buPROPion (WELLBUTRIN XL) 300 MG 24 hr tablet Take 300 mg by mouth daily.  1  . methocarbamol (ROBAXIN-750) 750 MG tablet Take 1 tablet (750 mg total) by mouth every 6 (six) hours as needed for muscle spasms. 25 tablet 0  . omeprazole (PRILOSEC) 20 MG capsule Take 20 mg by mouth 2 (two) times daily.    Marland Kitchen. oxyCODONE-acetaminophen (PERCOCET) 7.5-325 MG tablet Take 1-2 tablets by mouth every 4 (four) hours as needed for severe pain. 40 tablet 0  . Prenatal Vit-Fe Fumarate-FA (PRENATAL MULTIVITAMIN) TABS tablet Take 1 tablet by mouth daily at 12 noon.    Marland Kitchen. rOPINIRole (REQUIP) 1 MG tablet Take 1 mg by mouth at bedtime as needed.    . traMADol (ULTRAM) 50 MG tablet Take 1 tablet (50 mg total) by mouth every 6 (six) hours as needed. 30 tablet 0  . traZODone (DESYREL) 100 MG tablet Take 100 mg by mouth at bedtime as needed.  1  . UNABLE TO FIND Take 1 tablet by mouth once. ABORTION TABLET TAKEN ONCE ABOUT TWO WEEKS AGO 04/21/17     No current facility-administered medications on file prior to visit.     ROS: Constitutional: No  fever or chills Vision: No changes in vision ENT: No difficulty swallowing CV: No chest pain Pulm: No SOB or wheezing GI: No nausea or vomiting GU: No urgency or inability to hold urine Skin: No poor wound healing Neurologic: No numbness or tingling Psychiatric: No depression or anxiety Heme: No bruising Allergic: No reaction to medications or food   Exam: General: No acute distress Orientation: Awake alert and oriented  Left lower extremity: Reveals a warm swollen area over her tibia fracture that is significantly tender to palpation.  She is unable to bear weight on the leg due to pain.  She endorses sensation in the dorsum  and plantar aspect of her foot.  She has a warm well-perfused foot.   Medical Decision Making: Imaging: CT scan of her left ankle and lower leg shows findings consistent with cellulitis and possible early abscess formation.  The fracture appears to be healed on 3 out of 4 cortices with nearly full consolidation at the posterior aspect of her tibia  Medical history and chart was reviewed  Assessment/Plan: 27 year old female with a history of type I open tibia fracture status post I&D and intramedullary nailing now with new onset abscess formation in the setting of a healed tibia fracture  The acute onset and recent dental surgery makes me believe that she had seeded her area and developed a abscess.  She had failed oral antibiotics.  It is gotten worse.  Plan to proceed with incision and drainage.  Risks and benefits were discussed with the patient over the phone.  She agrees to proceed with surgery.  The patient will likely be admitted for overnight observation while the cultures returned.   Roby Lofts, MD [facility] [phone] (phone)

## 2017-10-27 ENCOUNTER — Encounter (HOSPITAL_COMMUNITY): Payer: Self-pay | Admitting: *Deleted

## 2017-10-27 ENCOUNTER — Ambulatory Visit (HOSPITAL_COMMUNITY): Payer: 59 | Admitting: Anesthesiology

## 2017-10-27 ENCOUNTER — Encounter (HOSPITAL_COMMUNITY)
Admission: RE | Disposition: A | Discharge status: Home / Self Care | Payer: Self-pay | Source: Ambulatory Visit | Attending: Student

## 2017-10-27 ENCOUNTER — Observation Stay (HOSPITAL_COMMUNITY)
Admission: RE | Admit: 2017-10-27 | Discharge: 2017-10-28 | Disposition: A | Discharge status: Home / Self Care | Payer: 59 | Source: Ambulatory Visit | Attending: Student | Admitting: Student

## 2017-10-27 DIAGNOSIS — B9562 Methicillin resistant Staphylococcus aureus infection as the cause of diseases classified elsewhere: Secondary | ICD-10-CM | POA: Insufficient documentation

## 2017-10-27 DIAGNOSIS — Z79899 Other long term (current) drug therapy: Secondary | ICD-10-CM | POA: Insufficient documentation

## 2017-10-27 DIAGNOSIS — L02416 Cutaneous abscess of left lower limb: Principal | ICD-10-CM | POA: Insufficient documentation

## 2017-10-27 DIAGNOSIS — J45909 Unspecified asthma, uncomplicated: Secondary | ICD-10-CM | POA: Diagnosis not present

## 2017-10-27 DIAGNOSIS — Z87891 Personal history of nicotine dependence: Secondary | ICD-10-CM | POA: Insufficient documentation

## 2017-10-27 HISTORY — DX: Gastro-esophageal reflux disease without esophagitis: K21.9

## 2017-10-27 HISTORY — PX: INCISION AND DRAINAGE ABSCESS: SHX5864

## 2017-10-27 HISTORY — DX: Major depressive disorder, single episode, unspecified: F32.9

## 2017-10-27 HISTORY — DX: Anxiety disorder, unspecified: F41.9

## 2017-10-27 HISTORY — DX: Depression, unspecified: F32.A

## 2017-10-27 LAB — CBC WITH DIFFERENTIAL/PLATELET
Abs Immature Granulocytes: 0.1 10*3/uL (ref 0.0–0.1)
Basophils Absolute: 0 10*3/uL (ref 0.0–0.1)
Basophils Relative: 0 %
EOS ABS: 0 10*3/uL (ref 0.0–0.7)
Eosinophils Relative: 0 %
HEMATOCRIT: 38.8 % (ref 36.0–46.0)
Hemoglobin: 11.6 g/dL — ABNORMAL LOW (ref 12.0–15.0)
Immature Granulocytes: 1 %
LYMPHS ABS: 1.8 10*3/uL (ref 0.7–4.0)
Lymphocytes Relative: 12 %
MCH: 22.6 pg — ABNORMAL LOW (ref 26.0–34.0)
MCHC: 29.9 g/dL — ABNORMAL LOW (ref 30.0–36.0)
MCV: 75.5 fL — AB (ref 78.0–100.0)
MONO ABS: 0.6 10*3/uL (ref 0.1–1.0)
MONOS PCT: 4 %
Neutro Abs: 12.1 10*3/uL — ABNORMAL HIGH (ref 1.7–7.7)
Neutrophils Relative %: 83 %
Platelets: 502 10*3/uL — ABNORMAL HIGH (ref 150–400)
RBC: 5.14 MIL/uL — ABNORMAL HIGH (ref 3.87–5.11)
RDW: 17.2 % — AB (ref 11.5–15.5)
WBC: 14.8 10*3/uL — ABNORMAL HIGH (ref 4.0–10.5)

## 2017-10-27 LAB — BASIC METABOLIC PANEL
Anion gap: 11 (ref 5–15)
BUN: 7 mg/dL (ref 6–20)
CHLORIDE: 98 mmol/L (ref 98–111)
CO2: 23 mmol/L (ref 22–32)
CREATININE: 0.68 mg/dL (ref 0.44–1.00)
Calcium: 8.6 mg/dL — ABNORMAL LOW (ref 8.9–10.3)
Glucose, Bld: 118 mg/dL — ABNORMAL HIGH (ref 70–99)
POTASSIUM: 4.2 mmol/L (ref 3.5–5.1)
SODIUM: 132 mmol/L — AB (ref 135–145)

## 2017-10-27 LAB — RAPID URINE DRUG SCREEN, HOSP PERFORMED
AMPHETAMINES: NOT DETECTED
Barbiturates: NOT DETECTED
Benzodiazepines: NOT DETECTED
Cocaine: NOT DETECTED
Opiates: POSITIVE — AB
TETRAHYDROCANNABINOL: NOT DETECTED

## 2017-10-27 LAB — C-REACTIVE PROTEIN: CRP: 29.2 mg/dL — ABNORMAL HIGH (ref <1.0)

## 2017-10-27 LAB — POCT PREGNANCY, URINE: PREG TEST UR: NEGATIVE

## 2017-10-27 LAB — SEDIMENTATION RATE: Sed Rate: 56 mm/hr — ABNORMAL HIGH (ref 0–22)

## 2017-10-27 LAB — SURGICAL PCR SCREEN
MRSA, PCR: NEGATIVE
Staphylococcus aureus: POSITIVE — AB

## 2017-10-27 SURGERY — INCISION AND DRAINAGE, ABSCESS
Anesthesia: General | Laterality: Left

## 2017-10-27 MED ORDER — HYDROMORPHONE HCL 1 MG/ML IJ SOLN
INTRAMUSCULAR | Status: AC
  Filled 2017-10-27: qty 1

## 2017-10-27 MED ORDER — HYDROMORPHONE HCL 1 MG/ML IJ SOLN
INTRAMUSCULAR | Status: AC
  Filled 2017-10-27: qty 0.5

## 2017-10-27 MED ORDER — DEXAMETHASONE SODIUM PHOSPHATE 10 MG/ML IJ SOLN
INTRAMUSCULAR | Status: AC
  Filled 2017-10-27: qty 1

## 2017-10-27 MED ORDER — POVIDONE-IODINE 10 % EX SWAB: 2.0000 "application " | Freq: Once | CUTANEOUS | Status: DC

## 2017-10-27 MED ORDER — DEXAMETHASONE SODIUM PHOSPHATE 10 MG/ML IJ SOLN
INTRAMUSCULAR | Status: DC | PRN
  Administered 2017-10-27: 10 mg via INTRAVENOUS

## 2017-10-27 MED ORDER — PROPOFOL 10 MG/ML IV BOLUS
INTRAVENOUS | Status: DC | PRN
  Administered 2017-10-27: 130 mg via INTRAVENOUS

## 2017-10-27 MED ORDER — CHLORHEXIDINE GLUCONATE 4 % EX LIQD: 60.0000 mL | Freq: Once | CUTANEOUS | Status: DC

## 2017-10-27 MED ORDER — VANCOMYCIN HCL IN DEXTROSE 750-5 MG/150ML-% IV SOLN
750.0000 mg | Freq: Two times a day (BID) | INTRAVENOUS | Status: DC
  Administered 2017-10-27 – 2017-10-28 (×2): 750 mg via INTRAVENOUS
  Filled 2017-10-27 (×3): qty 150

## 2017-10-27 MED ORDER — PHENYLEPHRINE 40 MCG/ML (10ML) SYRINGE FOR IV PUSH (FOR BLOOD PRESSURE SUPPORT)
PREFILLED_SYRINGE | INTRAVENOUS | Status: DC | PRN
  Administered 2017-10-27: 40 ug via INTRAVENOUS
  Administered 2017-10-27: 80 ug via INTRAVENOUS
  Administered 2017-10-27: 40 ug via INTRAVENOUS
  Administered 2017-10-27: 80 ug via INTRAVENOUS

## 2017-10-27 MED ORDER — MIDAZOLAM HCL 2 MG/2ML IJ SOLN
INTRAMUSCULAR | Status: AC
  Filled 2017-10-27: qty 2

## 2017-10-27 MED ORDER — ONDANSETRON HCL 4 MG/2ML IJ SOLN
INTRAMUSCULAR | Status: AC
  Filled 2017-10-27: qty 2

## 2017-10-27 MED ORDER — LIDOCAINE HCL (CARDIAC) PF 100 MG/5ML IV SOSY
PREFILLED_SYRINGE | INTRAVENOUS | Status: DC | PRN
  Administered 2017-10-27: 80 mg via INTRAVENOUS

## 2017-10-27 MED ORDER — FENTANYL CITRATE (PF) 250 MCG/5ML IJ SOLN
INTRAMUSCULAR | Status: DC | PRN
  Administered 2017-10-27 (×2): 100 ug via INTRAVENOUS
  Administered 2017-10-27: 50 ug via INTRAVENOUS

## 2017-10-27 MED ORDER — TOBRAMYCIN SULFATE 1.2 G IJ SOLR
INTRAMUSCULAR | Status: DC | PRN
  Administered 2017-10-27: 1.2 g via TOPICAL

## 2017-10-27 MED ORDER — MIDAZOLAM HCL 2 MG/2ML IJ SOLN
INTRAMUSCULAR | Status: DC | PRN
  Administered 2017-10-27: 2 mg via INTRAVENOUS

## 2017-10-27 MED ORDER — FENTANYL CITRATE (PF) 250 MCG/5ML IJ SOLN
INTRAMUSCULAR | Status: AC
  Filled 2017-10-27: qty 5

## 2017-10-27 MED ORDER — TRAZODONE HCL 100 MG PO TABS: 200.0000 mg | ORAL_TABLET | Freq: Every evening | ORAL | Status: DC | PRN

## 2017-10-27 MED ORDER — VANCOMYCIN HCL 1000 MG IV SOLR
INTRAVENOUS | Status: AC
  Filled 2017-10-27: qty 1000

## 2017-10-27 MED ORDER — PROPOFOL 10 MG/ML IV BOLUS
INTRAVENOUS | Status: AC
  Filled 2017-10-27: qty 20

## 2017-10-27 MED ORDER — PIPERACILLIN-TAZOBACTAM 3.375 G IVPB 30 MIN: 3.3750 g | Freq: Four times a day (QID) | INTRAVENOUS | Status: DC

## 2017-10-27 MED ORDER — HYDROMORPHONE HCL 1 MG/ML IJ SOLN
0.2500 mg | INTRAMUSCULAR | Status: DC | PRN
  Administered 2017-10-27 (×4): 0.5 mg via INTRAVENOUS

## 2017-10-27 MED ORDER — DIPHENHYDRAMINE HCL 12.5 MG/5ML PO ELIX: 12.5000 mg | ORAL_SOLUTION | ORAL | Status: DC | PRN

## 2017-10-27 MED ORDER — HYDROMORPHONE HCL 1 MG/ML IJ SOLN
INTRAMUSCULAR | Status: DC | PRN
  Administered 2017-10-27: 0.5 mg via INTRAVENOUS

## 2017-10-27 MED ORDER — ACETAMINOPHEN 325 MG PO TABS: 650.0000 mg | ORAL_TABLET | Freq: Four times a day (QID) | ORAL | Status: DC | PRN

## 2017-10-27 MED ORDER — VANCOMYCIN HCL 1000 MG IV SOLR
INTRAVENOUS | Status: DC | PRN
  Administered 2017-10-27: 2 g via TOPICAL

## 2017-10-27 MED ORDER — LACTATED RINGERS IV SOLN
INTRAVENOUS | Status: DC
  Administered 2017-10-27 (×2): via INTRAVENOUS

## 2017-10-27 MED ORDER — OXYCODONE-ACETAMINOPHEN 5-325 MG PO TABS: 1.0000 | ORAL_TABLET | ORAL | Status: DC | PRN

## 2017-10-27 MED ORDER — LIDOCAINE 2% (20 MG/ML) 5 ML SYRINGE
INTRAMUSCULAR | Status: AC
  Filled 2017-10-27: qty 5

## 2017-10-27 MED ORDER — SODIUM CHLORIDE 0.9 % IR SOLN
Status: DC | PRN
  Administered 2017-10-27: 3000 mL

## 2017-10-27 MED ORDER — METHOCARBAMOL 500 MG PO TABS: 500.0000 mg | ORAL_TABLET | Freq: Four times a day (QID) | ORAL | Status: DC | PRN

## 2017-10-27 MED ORDER — MORPHINE SULFATE (PF) 2 MG/ML IV SOLN: 1.0000 mg | INTRAVENOUS | Status: DC | PRN

## 2017-10-27 MED ORDER — ONDANSETRON HCL 4 MG/2ML IJ SOLN: 4.0000 mg | Freq: Four times a day (QID) | INTRAMUSCULAR | Status: DC | PRN

## 2017-10-27 MED ORDER — TOBRAMYCIN SULFATE 1.2 G IJ SOLR
INTRAMUSCULAR | Status: AC
  Filled 2017-10-27: qty 1.2

## 2017-10-27 MED ORDER — LORAZEPAM 1 MG PO TABS: 1.0000 mg | ORAL_TABLET | Freq: Four times a day (QID) | ORAL | Status: DC | PRN

## 2017-10-27 MED ORDER — PIPERACILLIN-TAZOBACTAM 3.375 G IVPB
3.3750 g | Freq: Three times a day (TID) | INTRAVENOUS | Status: DC
  Administered 2017-10-27 (×2): 3.375 g via INTRAVENOUS
  Filled 2017-10-27 (×4): qty 50

## 2017-10-27 MED ORDER — METHOCARBAMOL 1000 MG/10ML IJ SOLN
500.0000 mg | Freq: Four times a day (QID) | INTRAVENOUS | Status: DC | PRN
  Filled 2017-10-27: qty 5

## 2017-10-27 MED ORDER — ACETAMINOPHEN 650 MG RE SUPP: 650.0000 mg | Freq: Four times a day (QID) | RECTAL | Status: DC | PRN

## 2017-10-27 MED ORDER — ONDANSETRON HCL 4 MG/2ML IJ SOLN
INTRAMUSCULAR | Status: DC | PRN
  Administered 2017-10-27: 4 mg via INTRAVENOUS

## 2017-10-27 MED ORDER — PHENYLEPHRINE 40 MCG/ML (10ML) SYRINGE FOR IV PUSH (FOR BLOOD PRESSURE SUPPORT)
PREFILLED_SYRINGE | INTRAVENOUS | Status: AC
  Filled 2017-10-27: qty 10

## 2017-10-27 MED ORDER — NORETHIN ACE-ETH ESTRAD-FE 1-20 MG-MCG(24) PO TABS: 1.0000 | ORAL_TABLET | Freq: Every day | ORAL | Status: DC

## 2017-10-27 MED ORDER — ONDANSETRON HCL 4 MG PO TABS: 4.0000 mg | ORAL_TABLET | Freq: Four times a day (QID) | ORAL | Status: DC | PRN

## 2017-10-27 MED ORDER — 0.9 % SODIUM CHLORIDE (POUR BTL) OPTIME
TOPICAL | Status: DC | PRN
  Administered 2017-10-27: 1000 mL

## 2017-10-27 MED ORDER — CEFAZOLIN SODIUM-DEXTROSE 2-4 GM/100ML-% IV SOLN
2.0000 g | INTRAVENOUS | Status: AC
  Administered 2017-10-27: 2 g via INTRAVENOUS
  Filled 2017-10-27: qty 100

## 2017-10-27 SURGICAL SUPPLY — 45 items
BANDAGE ACE 4X5 VEL STRL LF (GAUZE/BANDAGES/DRESSINGS) ×2 IMPLANT
BNDG COHESIVE 4X5 TAN STRL (GAUZE/BANDAGES/DRESSINGS) ×3 IMPLANT
BNDG GAUZE ELAST 4 BULKY (GAUZE/BANDAGES/DRESSINGS) ×4 IMPLANT
BRUSH SCRUB SURG 4.25 DISP (MISCELLANEOUS) ×3 IMPLANT
CHLORAPREP W/TINT 26ML (MISCELLANEOUS) ×3 IMPLANT
COVER MAYO STAND STRL (DRAPES) ×3 IMPLANT
COVER SURGICAL LIGHT HANDLE (MISCELLANEOUS) ×6 IMPLANT
DRAPE ORTHO SPLIT 77X108 STRL (DRAPES) ×3
DRAPE SURG 17X23 STRL (DRAPES) ×3 IMPLANT
DRAPE SURG ORHT 6 SPLT 77X108 (DRAPES) ×1 IMPLANT
DRAPE U-SHAPE 47X51 STRL (DRAPES) ×3 IMPLANT
DRSG ADAPTIC 3X8 NADH LF (GAUZE/BANDAGES/DRESSINGS) ×3 IMPLANT
ELECT REM PT RETURN 9FT ADLT (ELECTROSURGICAL)
ELECTRODE REM PT RTRN 9FT ADLT (ELECTROSURGICAL) IMPLANT
EVACUATOR 1/8 PVC DRAIN (DRAIN) IMPLANT
GAUZE SPONGE 4X4 12PLY STRL (GAUZE/BANDAGES/DRESSINGS) ×3 IMPLANT
GAUZE SPONGE 4X4 12PLY STRL LF (GAUZE/BANDAGES/DRESSINGS) ×2 IMPLANT
GLOVE BIO SURGEON STRL SZ7.5 (GLOVE) ×12 IMPLANT
GLOVE BIOGEL PI IND STRL 7.5 (GLOVE) ×1 IMPLANT
GLOVE BIOGEL PI INDICATOR 7.5 (GLOVE) ×2
GOWN STRL REUS W/ TWL LRG LVL3 (GOWN DISPOSABLE) ×2 IMPLANT
GOWN STRL REUS W/TWL LRG LVL3 (GOWN DISPOSABLE) ×6
HANDPIECE INTERPULSE COAX TIP (DISPOSABLE)
KIT BASIN OR (CUSTOM PROCEDURE TRAY) ×3 IMPLANT
KIT TURNOVER KIT B (KITS) ×3 IMPLANT
MANIFOLD NEPTUNE II (INSTRUMENTS) ×3 IMPLANT
NS IRRIG 1000ML POUR BTL (IV SOLUTION) ×3 IMPLANT
PACK ORTHO EXTREMITY (CUSTOM PROCEDURE TRAY) ×3 IMPLANT
PAD ARMBOARD 7.5X6 YLW CONV (MISCELLANEOUS) ×6 IMPLANT
PADDING CAST COTTON 6X4 STRL (CAST SUPPLIES) ×3 IMPLANT
SET HNDPC FAN SPRY TIP SCT (DISPOSABLE) IMPLANT
SET IRRIG Y TYPE TUR BLADDER L (SET/KITS/TRAYS/PACK) ×2 IMPLANT
SPONGE LAP 18X18 X RAY DECT (DISPOSABLE) ×3 IMPLANT
SUT ETHILON 2 0 FS 18 (SUTURE) ×6 IMPLANT
SUT ETHILON 3 0 PS 1 (SUTURE) ×8 IMPLANT
SUT MON AB 2-0 CT1 36 (SUTURE) ×3 IMPLANT
SUT PDS AB 0 CT 36 (SUTURE) IMPLANT
SWAB CULTURE ESWAB REG 1ML (MISCELLANEOUS) ×2 IMPLANT
TOWEL OR 17X24 6PK STRL BLUE (TOWEL DISPOSABLE) ×3 IMPLANT
TOWEL OR 17X26 10 PK STRL BLUE (TOWEL DISPOSABLE) ×6 IMPLANT
TUBE CONNECTING 12'X1/4 (SUCTIONS) ×1
TUBE CONNECTING 12X1/4 (SUCTIONS) ×2 IMPLANT
UNDERPAD 30X30 (UNDERPADS AND DIAPERS) ×3 IMPLANT
WATER STERILE IRR 1000ML POUR (IV SOLUTION) ×3 IMPLANT
YANKAUER SUCT BULB TIP NO VENT (SUCTIONS) ×3 IMPLANT

## 2017-10-27 NOTE — Anesthesia Procedure Notes (Signed)
Procedure Name: LMA Insertion Date/Time: 10/27/2017 9:37 AM Performed by: Yolonda Kidaarver, Kayla Deshaies L, CRNA Pre-anesthesia Checklist: Patient identified, Emergency Drugs available, Suction available and Patient being monitored Patient Re-evaluated:Patient Re-evaluated prior to induction Oxygen Delivery Method: Circle system utilized Preoxygenation: Pre-oxygenation with 100% oxygen Induction Type: IV induction LMA: LMA inserted LMA Size: 4.0 Number of attempts: 1 Placement Confirmation: positive ETCO2,  CO2 detector and breath sounds checked- equal and bilateral Tube secured with: Tape Dental Injury: Teeth and Oropharynx as per pre-operative assessment

## 2017-10-27 NOTE — Anesthesia Postprocedure Evaluation (Signed)
Anesthesia Post Note  Patient: Leah NordmannMeredith A Currin  Procedure(s) Performed: INCISION AND DRAINAGE ABSCESS (Left )     Patient location during evaluation: PACU Anesthesia Type: General Level of consciousness: awake and alert Pain management: pain level controlled Vital Signs Assessment: post-procedure vital signs reviewed and stable Respiratory status: spontaneous breathing, nonlabored ventilation, respiratory function stable and patient connected to nasal cannula oxygen Cardiovascular status: blood pressure returned to baseline and stable Postop Assessment: no apparent nausea or vomiting Anesthetic complications: no    Last Vitals:  Vitals:   10/27/17 1103 10/27/17 1136  BP: 105/69 104/68  Pulse: 94 (!) 101  Resp: (!) 21   Temp:  37.5 C  SpO2: 96% 99%    Last Pain:  Vitals:   10/27/17 1224  TempSrc:   PainSc: 0-No pain                 Guadalupe Nickless L Trevaris Pennella

## 2017-10-27 NOTE — Interval H&P Note (Signed)
History and Physical Interval Note:  10/27/2017 9:15 AM  [name]  has presented today for surgery, with the diagnosis of Left leg abscess  The various methods of treatment have been discussed with the patient and family. After consideration of risks, benefits and other options for treatment, the patient has consented to  Procedure(s): INCISION AND DRAINAGE ABSCESS (Left) as a surgical intervention .  The patient's history has been reviewed, patient examined, no change in status, stable for surgery.  I have reviewed the patient's chart and labs.  Questions were answered to the patient's satisfaction.     Caryn BeeKevin P Aero Drummonds

## 2017-10-27 NOTE — Progress Notes (Signed)
ANTIBIOTIC CONSULT NOTE - INITIAL  Pharmacy Consult for Vanco Indication: leg abscess  Allergies  Allergen Reactions  . Seroquel [Quetiapine]     BODY CONVULSIONS    Patient Measurements: Height: 5\' 8"  (172.7 cm) Weight: 130 lb (59 kg) IBW/kg (Calculated) : 63.9 Adjusted Body Weight:    Vital Signs: Temp: 97.3 F (36.3 C) (08/23 1018) Temp Source: Oral (08/23 0732) BP: 105/69 (08/23 1103) Pulse Rate: 94 (08/23 1103) Intake/Output from previous day: No intake/output data recorded. Intake/Output from this shift: Total I/O In: 1000 [I.V.:1000] Out: 10 [Blood:10]  Labs: Recent Labs    10/27/17 0736  WBC 14.8*  HGB 11.6*  PLT 502*   CrCl cannot be calculated (Patient's most recent lab result is older than the maximum 21 days allowed.). No results for input(s): VANCOTROUGH, VANCOPEAK, VANCORANDOM, GENTTROUGH, GENTPEAK, GENTRANDOM, TOBRATROUGH, TOBRAPEAK, TOBRARND, AMIKACINPEAK, AMIKACINTROU, AMIKACIN in the last 72 hours.   Microbiology: Recent Results (from the past 720 hour(s))  Surgical pcr screen     Status: Abnormal   Collection Time: 10/27/17  7:48 AM  Result Value Ref Range Status   MRSA, PCR NEGATIVE NEGATIVE Final   Staphylococcus aureus POSITIVE (A) NEGATIVE Final    Comment: (NOTE) The Xpert SA Assay (FDA approved for NASAL specimens in patients 922 years of age and older), is one component of a comprehensive surveillance program. It is not intended to diagnose infection nor to guide or monitor treatment. Performed at [facility], [address] [place], [place] [address]     Medical History: Past Medical History:  Diagnosis Date  . Anxiety   . Asthma    last inhaler use "years - as a child  . Depression   . GERD (gastroesophageal reflux disease)    Assessment: CC/HPI: L leg abscess s/p I&D 8/22  PMH: asthma, h/o IVDA  Significant events: I&D and intramedullary nailing of a open left tibial shaft fracture that was performed on  05/05/2017. About a week ago she developed acute onset pain at her fracture site with a significant swelling and redness. CT scan showed possible abscess collection.  Prescribed Bactrim w but her symptoms have worsened with increased swelling and pain.   ID: L leg abscess s/p I&D 8/22. Afebrile. WBC 14.8. Scr unknown. 8/23 Vanco>>  Goal of Therapy:  Vancomycin trough level 10-15 mcg/ml  Plan:  Vancomycin 750mg  IV q 12 hrs Trough after 3-5 doses at steady state Bmet for Scr  Jeston Junkins S. Merilynn Finlandobertson, PharmD, BCPS Clinical Staff Pharmacist  Misty Stanleyobertson, Malynn Lucy Stillinger 10/27/2017,11:24 AM

## 2017-10-27 NOTE — Transfer of Care (Signed)
Immediate Anesthesia Transfer of Care Note  Patient: Leah Whitaker  Procedure(s) Performed: INCISION AND DRAINAGE ABSCESS (Left )  Patient Location: PACU  Anesthesia Type:General  Level of Consciousness: awake, alert , oriented, drowsy and patient cooperative  Airway & Oxygen Therapy: Patient Spontanous Breathing and Patient connected to nasal cannula oxygen  Post-op Assessment: Report given to RN and Post -op Vital signs reviewed and stable  Post vital signs: Reviewed and stable  Last Vitals:  Vitals Value Taken Time  BP 119/72 10/27/2017 10:18 AM  Temp    Pulse 96 10/27/2017 10:19 AM  Resp 13 10/27/2017 10:19 AM  SpO2 100 % 10/27/2017 10:19 AM  Vitals shown include unvalidated device data.  Last Pain:  Vitals:   10/27/17 0756  TempSrc:   PainSc: 7          Complications: No apparent anesthesia complications

## 2017-10-27 NOTE — Op Note (Signed)
[facility]thopaedic[facility]gery[facility]ative[facility] [phone](CSN:[id number]) Date of Surgery: 10/27/2017  Admit Date: 10/27/2017   Diagnoses: Pre-Op Diagnoses: Left lower leg abscess  Post-Op Diagnosis: Same  Procedures: CPT 27603-Incision and drainage of left leg abscess   Surgeons: Primary: Roby LoftsHaddix, Exa Bomba P, MD   Location:[facility] OR ROOM 03   AnesthesiaGeneral   Antibiotics:Ancef 2g after cultures   Tourniquettime:None  EstimatedBloodLoss:10 mL   Complications:None  Specimens: ID Type Source Tests Collected by Time Destination  A : left leg abcess Abscess Abscess AEROBIC/ANAEROBIC CULTURE (SURGICAL/DEEP WOUND) Janetta Vandoren, Gillie MannersKevin P, MD 10/27/2017 1001     Implants: None  IndicationsforSurgery: 27 y.o. female who is presenting for surgery for a lower extremity abscess.  The patient underwent I&D and intramedullary nailing of a open left tibial shaft fracture that was performed on 05/05/2017.  She had done well and was ambulating well until about a week ago where she developed acute onset pain at her fracture site with a significant swelling and redness.  She presented to clinic earlier this week at which point I obtained a CT scan which showed possible abscess collection.  Since that time I had prescribed her Bactrim which she has started but her symptoms have worsened with increased swelling and pain.  In light of the symptoms and the findings on CT scan I recommended presenting for incision and drainage of abscess with cultures. Risks and benefits were discussed with the patient.  She agrees to proceed with surgery.  Operative Findings: Healed tibial shaft fracture with significant abscess that was successfully decompressed and irrigated.  Procedure: The patient was identified in the preoperative holding area. Consent was confirmed with the patient and their family and all questions were answered. The operative extremity was marked after confirmation with the patient. she was then brought back to the  operating room by our anesthesia colleagues.  She was then placed under general anesthetic and carefully transferred over to a regular or table. The operative extremity was then prepped and draped in usual sterile fashion. A preoperative timeout was performed to verify the patient, the procedure, and the extremity.  Through her previous traumatic laceration from her open fracture I made a small incision.  I immediately encountered a large amount of purulent material.  I was able to express nearly 30 to 40 cc of pus.  Cultures were obtained.  Antibiotics were given.  I then used cystoscopy tubing to thoroughly irrigate the abscess.  Used a ronguer and curette to debride the area.  I was able to visualize the fracture both medially and anteriorly.  It was completely healed.  There was no nonunion seam.  A gram of vancomycin powder 1.2 g of tobramycin powder were placed into the wound.  A loose closure of 3-0 nylon was placed.  A dressing consisting of Adaptic, 4 x 4's and ABD pad and an Ace wrap was placed over the incision.  She was then awoken from anesthesia and taken to PACU in stable condition.  Post Op Plan/Instructions: Patient will be admitted overnight for IV antibiotics.  We will await return of the cultures.  Will likely discharge home postoperative day 1 on p.o. antibiotics.  No DVT prophylaxis is needed in this patient.  I was present and performed the entire surgery.  Truitt MerleKevin Renezmae Canlas, MD [facility]

## 2017-10-28 ENCOUNTER — Encounter (HOSPITAL_COMMUNITY): Payer: Self-pay | Admitting: Student

## 2017-10-28 DIAGNOSIS — L02416 Cutaneous abscess of left lower limb: Secondary | ICD-10-CM | POA: Diagnosis not present

## 2017-10-28 MED ORDER — DOXYCYCLINE HYCLATE 100 MG PO TABS: 100.0000 mg | ORAL_TABLET | Freq: Two times a day (BID) | ORAL | 0 refills | Status: DC

## 2017-10-28 MED ORDER — CIPROFLOXACIN HCL 500 MG PO TABS: 500.0000 mg | ORAL_TABLET | Freq: Two times a day (BID) | ORAL | 0 refills | Status: DC

## 2017-10-28 NOTE — Discharge Summary (Signed)
SPORTS MEDICINE & JOINT REPLACEMENT   Leah Spurling, MD   Laurier Nancy, PA-C [address] [place], [place], [place]  [address]                             [phone]  PATIENT ID: Leah Whitaker        MRN:  [id number]          DOB/AGE: September 09, 1990 / 27 y.o.    DISCHARGE SUMMARY  ADMISSION DATE:    10/27/2017 DISCHARGE DATE:   10/28/2017   ADMISSION DIAGNOSIS: Left leg abscess    DISCHARGE DIAGNOSIS:  Left leg abscess    ADDITIONAL DIAGNOSIS: Principal Problem:   Abscess of left lower leg  Past Medical History:  Diagnosis Date  . Anxiety   . Asthma    last inhaler use "years - as a child  . Depression   . GERD (gastroesophageal reflux disease)     PROCEDURE: Procedure(s): INCISION AND DRAINAGE ABSCESS on 10/27/2017  CONSULTS:    HISTORY:  See H&P in chart  HOSPITAL COURSE:  Leah Whitaker is a 27 y.o. admitted on 10/27/2017 and found to have a diagnosis of Left leg abscess.  After appropriate laboratory studies were obtained  they were taken to the operating room on 10/27/2017 and underwent Procedure(s): INCISION AND DRAINAGE ABSCESS.   They were given perioperative antibiotics:  Anti-infectives (From admission, onward)   Start     Dose/Rate Route Frequency Ordered Stop   10/27/17 1300  piperacillin-tazobactam (ZOSYN) IVPB 3.375 g     3.375 g 12.5 mL/hr over 240 Minutes Intravenous Every 8 hours 10/27/17 1132     10/27/17 1200  piperacillin-tazobactam (ZOSYN) IVPB 3.375 g  Status:  Discontinued     3.375 g 100 mL/hr over 30 Minutes Intravenous Every 6 hours 10/27/17 1120 10/27/17 1132   10/27/17 1200  vancomycin (VANCOCIN) IVPB 750 mg/150 ml premix     750 mg 150 mL/hr over 60 Minutes Intravenous Every 12 hours 10/27/17 1125     10/27/17 1013  vancomycin (VANCOCIN) powder  Status:  Discontinued       As needed 10/27/17 1013 10/27/17 1014   10/27/17 1007  tobramycin (NEBCIN) powder  Status:  Discontinued       As needed 10/27/17 1013 10/27/17 1014    10/27/17 0730  ceFAZolin (ANCEF) IVPB 2g/100 mL premix     2 g 200 mL/hr over 30 Minutes Intravenous On call to O.R. 10/27/17 4696 10/27/17 2952    .  Patient given tranexamic acid IV or topical and exparel intra-operatively.  Tolerated the procedure well.    POD# 1: Vital signs were stable.  Patient denied Chest pain, shortness of breath, or calf pain.  Patient was started on Lovenox 30 mg subcutaneously twice daily at 8am.  Consults to PT, OT, and care management were made.  The patient was weight bearing as tolerated.  CPM was placed on the operative leg 0-90 degrees for 6-8 hours a day. When out of the CPM, patient was placed in the foam block to achieve full extension. Incentive spirometry was taught.  Dressing was changed.       POD #2, Continued  PT for ambulation and exercise program.  IV saline locked.  O2 discontinued.    The remainder of the hospital course was dedicated to ambulation and strengthening.   The patient was discharged on 1 Day Post-Op in  Good condition.  Blood products given:none  DIAGNOSTIC STUDIES: Recent vital signs:  Patient Vitals for the past 24 hrs:  BP Temp Temp src Pulse Resp SpO2  10/28/17 0414 97/64 98.4 F (36.9 C) Oral (!) 57 18 99 %  10/27/17 1952 113/66 100.1 F (37.8 C) Oral 96 - 98 %  10/27/17 1136 104/68 99.5 F (37.5 C) Oral (!) 101 - 99 %  10/27/17 1103 105/69 - - 94 (!) 21 96 %  10/27/17 1048 112/75 (!) 97.3 F (36.3 C) - 96 17 100 %  10/27/17 1043 - - - (!) 102 (!) 21 100 %  10/27/17 1033 127/79 - - (!) 105 (!) 22 100 %  10/27/17 1027 - - - (!) 103 19 100 %  10/27/17 1018 119/72 (!) 97.3 F (36.3 C) - 96 17 100 %       Recent laboratory studies: Recent Labs    10/27/17 0736  WBC 14.8*  HGB 11.6*  HCT 38.8  PLT 502*   Recent Labs    10/27/17 1135  NA 132*  K 4.2  CL 98  CO2 23  BUN 7  CREATININE 0.68  GLUCOSE 118*  CALCIUM 8.6*   No results found for: INR, PROTIME   Recent Radiographic Studies :  Ct Ankle  Left W Contrast  Result Date: 10/25/2017 CLINICAL DATA:  Left ankle redness. Prior tibia intramedullary nail insertion for distal tibial fracture in March 2019. EXAM: CT OF THE LEFT ANKLE WITH CONTRAST TECHNIQUE: Multidetector CT imaging of the left ankle was performed following the standard protocol during bolus administration of intravenous contrast. CONTRAST:  80mL ISOVUE-300 IOPAMIDOL (ISOVUE-300) INJECTION 61% COMPARISON:  Left tibia/fibula x-rays and CT dated May 05, 2017. FINDINGS: Bones/Joint/Cartilage No acute fracture or dislocation. Incompletely healed distal tibial diaphyseal fracture status post intramedullary nail placement, with portions of the fracture line still visible, predominantly posteriorly. No evidence of hardware failure or loosening. The fracture component involving the tibial plafond has nearly healed, with minimal 1 mm articular surface incongruity. Healed mildly displaced distal fibular diaphyseal fracture. The ankle mortise is symmetric. The talar dome is intact. No periosteal reaction or cortical destruction. Small tibiotalar joint effusion. Ligaments Suboptimally assessed by CT. Muscles and Tendons The flexor, extensor, peroneal, and Achilles tendons are grossly intact. No muscle atrophy. Soft tissues Ill-defined area of fluid measuring approximately 1.1 x 2.9 x 2.5 cm along the anterior distal tibia at the level of the fracture, medial and deep to the anterior tibialis muscle. Moderate skin thickening and soft tissue swelling along the anteromedial lower leg. Mild bimalleolar soft tissue swelling. IMPRESSION: 1. Moderate skin thickening and soft tissue swelling along the anteromedial lower leg, concerning for cellulitis given clinical history. Ill-defined area of fluid measuring approximately 2.9 cm abutting the anterior distal tibia at the level of the fracture medial and deep to the anterior tibialis muscle could represent phlegmon or early abscess. No radiographic evidence of  osteomyelitis. 2. Incompletely healed distal tibial fracture status post ORIF. No hardware complication. 3. Healed tibial plafond and distal fibular fractures. Electronically Signed   By: Obie DredgeWilliam T Derry M.D.   On: 10/25/2017 10:19    DISCHARGE INSTRUCTIONS:   DISCHARGE MEDICATIONS:     FOLLOW UP VISIT:   Follow-up Information    Haddix, Gillie MannersKevin P, MD. Schedule an appointment as soon as possible for a visit in 2 week(s).   Specialty:  Orthopedic Surgery Contact information: [address] [place] [id number] [phone]           DISPOSITION: HOME  VS. SNF  CONDITION:  Good   Guy Sandifer 10/28/2017, 9:24 AM

## 2017-10-28 NOTE — Discharge Instructions (Signed)
Orthopaedic Trauma Service Discharge Instructions   General Discharge Instructions  WEIGHT BEARING STATUS:Weight bearing as tolerated  RANGE OF MOTION/ACTIVITY:No restrictions  Wound Care: Remove dressing on Sunday (postoperative day 2). See wound care instructions below  DVT/PE prophylaxis:None needed  Diet: as you were eating previously.  Can use over the counter stool softeners and bowel preparations, such as Miralax, to help with bowel movements.  Narcotics can be constipating.  Be sure to drink plenty of fluids  PAIN MEDICATION USE AND EXPECTATIONS  You have likely been given narcotic medications to help control your pain.  After a traumatic event that results in an fracture (broken bone) with or without surgery, it is ok to use narcotic pain medications to help control one's pain.  We understand that everyone responds to pain differently and each individual patient will be evaluated on a regular basis for the continued need for narcotic medications. Ideally, narcotic medication use should last no more than 6-8 weeks (coinciding with fracture healing).   As a patient it is your responsibility as well to monitor narcotic medication use and report the amount and frequency you use these medications when you come to your office visit.   We would also advise that if you are using narcotic medications, you should take a dose prior to therapy to maximize you participation.  IF YOU ARE ON NARCOTIC MEDICATIONS IT IS NOT PERMISSIBLE TO OPERATE A MOTOR VEHICLE (MOTORCYCLE/CAR/TRUCK/MOPED) OR HEAVY MACHINERY DO NOT MIX NARCOTICS WITH OTHER CNS (CENTRAL NERVOUS SYSTEM) DEPRESSANTS SUCH AS ALCOHOL   STOP SMOKING OR USING NICOTINE PRODUCTS!!!!  As discussed nicotine severely impairs your body's ability to heal surgical and traumatic wounds but also impairs bone healing.  Wounds and bone heal by forming microscopic blood vessels (angiogenesis) and nicotine is a vasoconstrictor (essentially, shrinks  blood vessels).  Therefore, if vasoconstriction occurs to these microscopic blood vessels they essentially disappear and are unable to deliver necessary nutrients to the healing tissue.  This is one modifiable factor that you can do to dramatically increase your chances of healing your injury.    (This means no smoking, no nicotine gum, patches, etc)     ICE AND ELEVATE INJURED/OPERATIVE EXTREMITY  Using ice and elevating the injured extremity above your heart can help with swelling and pain control.  Icing in a pulsatile fashion, such as 20 minutes on and 20 minutes off, can be followed.    Do not place ice directly on skin. Make sure there is a barrier between to skin and the ice pack.    Using frozen items such as frozen peas works well as the conform nicely to the are that needs to be iced.  USE AN ACE WRAP OR TED HOSE FOR SWELLING CONTROL  In addition to icing and elevation, Ace wraps or TED hose are used to help limit and resolve swelling.  It is recommended to use Ace wraps or TED hose until you are informed to stop.    When using Ace Wraps start the wrapping distally (farthest away from the body) and wrap proximally (closer to the body)   Example: If you had surgery on your leg or thing and you do not have a splint on, start the ace wrap at the toes and work your way up to the thigh        If you had surgery on your upper extremity and do not have a splint on, start the ace wrap at your fingers and work your way up to the  upper arm  CALL THE OFFICE WITH ANY QUESTIONS OR CONCERNS: [phone]    Discharge Wound Care Instructions  Do NOT apply any ointments, solutions or lotions to pin sites or surgical wounds.  These prevent needed drainage and even though solutions like hydrogen peroxide kill bacteria, they also damage cells lining the pin sites that help fight infection.  Applying lotions or ointments can keep the wounds moist and can cause them to breakdown and open up as well. This  can increase the risk for infection. When in doubt call the office.  Surgical incisions should be dressed daily.  If any drainage is noted, use one layer of adaptic, then gauze, Kerlix, and an ace wrap.  Once the incision is completely dry and without drainage, it may be left open to air out.  Showering may begin 36-48 hours later.  Cleaning gently with soap and water.  Traumatic wounds should be dressed daily as well.    One layer of adaptic, gauze, Kerlix, then ace wrap.  The adaptic can be discontinued once the draining has ceased    If you have a wet to dry dressing: wet the gauze with saline the squeeze as much saline out so the gauze is moist (not soaking wet), place moistened gauze over wound, then place a dry gauze over the moist one, followed by Kerlix wrap, then ace wrap.

## 2017-10-28 NOTE — Progress Notes (Signed)
SPORTS MEDICINE AND JOINT REPLACEMENT  Leah SpurlingStephen Lucey, MD    Leah Nancyolby Robbins, PA-C [address], [place], [place]  [id number]                             [phone]   PROGRESS NOTE  Subjective:  negative for Chest Pain  negative for Shortness of Breath  negative for Nausea/Vomiting   negative for Calf Pain  negative for Bowel Movement   Tolerating Diet: yes         Patient reports pain as 3 on 0-10 scale.    Objective: Vital signs in last 24 hours:    Patient Vitals for the past 24 hrs:  BP Temp Temp src Pulse Resp SpO2  10/28/17 0414 97/64 98.4 F (36.9 C) Oral (!) 57 18 99 %  10/27/17 1952 113/66 100.1 F (37.8 C) Oral 96 - 98 %  10/27/17 1136 104/68 99.5 F (37.5 C) Oral (!) 101 - 99 %  10/27/17 1103 105/69 - - 94 (!) 21 96 %  10/27/17 1048 112/75 (!) 97.3 F (36.3 C) - 96 17 100 %  10/27/17 1043 - - - (!) 102 (!) 21 100 %  10/27/17 1033 127/79 - - (!) 105 (!) 22 100 %  10/27/17 1027 - - - (!) 103 19 100 %  10/27/17 1018 119/72 (!) 97.3 F (36.3 C) - 96 17 100 %    @flow {1959:LAST@   Intake/Output from previous day:   08/23 0701 - 08/24 0700 In: 2836.4 [I.V.:1980] Out: 10    Intake/Output this shift:   No intake/output data recorded.   Intake/Output      08/23 0701 - 08/24 0700 08/24 0701 - 08/25 0700   I.V. (mL/kg) 1980 (33.6)    IV Piggyback 856.4    Total Intake(mL/kg) 2836.4 (48.1)    Urine (mL/kg/hr) 0    Blood 10    Total Output 10    Net +2826.4         Urine Occurrence 3 x       LABORATORY DATA: Recent Labs    10/27/17 0736  WBC 14.8*  HGB 11.6*  HCT 38.8  PLT 502*   Recent Labs    10/27/17 1135  NA 132*  K 4.2  CL 98  CO2 23  BUN 7  CREATININE 0.68  GLUCOSE 118*  CALCIUM 8.6*   No results found for: INR, PROTIME  Examination:  General appearance: alert, cooperative and no distress Extremities: extremities normal, atraumatic, no cyanosis or edema  Wound Exam: clean, dry, intact   Drainage:  None: wound tissue  dry  Motor Exam: Anterior Tibial, Posterior Tibial, Quadriceps and Hamstrings Intact  Sensory Exam: Superficial Peroneal, Deep Peroneal and Tibial normal   Assessment:    1 Day Post-Op  Procedure(s) (LRB): INCISION AND DRAINAGE ABSCESS (Left)  ADDITIONAL DIAGNOSIS:  Principal Problem:   Abscess of left lower leg     Plan: Physical Therapy as ordered Weight Bearing as Tolerated (WBAT)   DISCHARGE PLAN: Home  Patient doing well, cultures still pending. Patient ready for D/C home on antibiotics         Leah Whitaker 10/28/2017, 9:23 AM

## 2017-10-28 NOTE — Plan of Care (Signed)
  Problem: Education: Goal: Knowledge of General Education information will improve Description: Including pain rating scale, medication(s)/side effects and non-pharmacologic comfort measures Outcome: Adequate for Discharge   Problem: Health Behavior/Discharge Planning: Goal: Ability to manage health-related needs will improve Outcome: Adequate for Discharge   Problem: Clinical Measurements: Goal: Ability to maintain clinical measurements within normal limits will improve Outcome: Adequate for Discharge Goal: Will remain free from infection Outcome: Adequate for Discharge Goal: Diagnostic test results will improve Outcome: Adequate for Discharge Goal: Respiratory complications will improve Outcome: Adequate for Discharge Goal: Cardiovascular complication will be avoided Outcome: Adequate for Discharge   Problem: Activity: Goal: Risk for activity intolerance will decrease Outcome: Adequate for Discharge   Problem: Nutrition: Goal: Adequate nutrition will be maintained Outcome: Adequate for Discharge   Problem: Coping: Goal: Level of anxiety will decrease Outcome: Adequate for Discharge   Problem: Elimination: Goal: Will not experience complications related to bowel motility Outcome: Adequate for Discharge Goal: Will not experience complications related to urinary retention Outcome: Adequate for Discharge   Problem: Safety: Goal: Ability to remain free from injury will improve Outcome: Adequate for Discharge   Problem: Skin Integrity: Goal: Risk for impaired skin integrity will decrease Outcome: Adequate for Discharge   

## 2017-11-01 LAB — AEROBIC/ANAEROBIC CULTURE W GRAM STAIN (SURGICAL/DEEP WOUND)

## 2017-11-01 LAB — AEROBIC/ANAEROBIC CULTURE (SURGICAL/DEEP WOUND)

## 2018-08-23 ENCOUNTER — Emergency Department (HOSPITAL_COMMUNITY)
Admission: EM | Admit: 2018-08-23 | Discharge: 2018-08-23 | Disposition: A | Discharge status: Home / Self Care | Payer: Self-pay | Attending: Emergency Medicine | Admitting: Emergency Medicine

## 2018-08-23 ENCOUNTER — Other Ambulatory Visit: Payer: Self-pay

## 2018-08-23 ENCOUNTER — Emergency Department (HOSPITAL_COMMUNITY): Payer: Self-pay

## 2018-08-23 DIAGNOSIS — F17210 Nicotine dependence, cigarettes, uncomplicated: Secondary | ICD-10-CM | POA: Insufficient documentation

## 2018-08-23 DIAGNOSIS — L03115 Cellulitis of right lower limb: Secondary | ICD-10-CM

## 2018-08-23 DIAGNOSIS — L02415 Cutaneous abscess of right lower limb: Secondary | ICD-10-CM | POA: Insufficient documentation

## 2018-08-23 DIAGNOSIS — J45909 Unspecified asthma, uncomplicated: Secondary | ICD-10-CM | POA: Insufficient documentation

## 2018-08-23 DIAGNOSIS — Z79899 Other long term (current) drug therapy: Secondary | ICD-10-CM | POA: Insufficient documentation

## 2018-08-23 LAB — CBC WITH DIFFERENTIAL/PLATELET
Abs Immature Granulocytes: 0.05 10*3/uL (ref 0.00–0.07)
Basophils Absolute: 0 10*3/uL (ref 0.0–0.1)
Basophils Relative: 0 %
Eosinophils Absolute: 0.1 10*3/uL (ref 0.0–0.5)
Eosinophils Relative: 1 %
HCT: 37.6 % (ref 36.0–46.0)
Hemoglobin: 11.3 g/dL — ABNORMAL LOW (ref 12.0–15.0)
Immature Granulocytes: 1 %
Lymphocytes Relative: 20 %
Lymphs Abs: 1.9 10*3/uL (ref 0.7–4.0)
MCH: 24.4 pg — ABNORMAL LOW (ref 26.0–34.0)
MCHC: 30.1 g/dL (ref 30.0–36.0)
MCV: 81 fL (ref 80.0–100.0)
Monocytes Absolute: 1.3 10*3/uL — ABNORMAL HIGH (ref 0.1–1.0)
Monocytes Relative: 14 %
Neutro Abs: 6 10*3/uL (ref 1.7–7.7)
Neutrophils Relative %: 64 %
Platelets: 307 10*3/uL (ref 150–400)
RBC: 4.64 MIL/uL (ref 3.87–5.11)
RDW: 18.1 % — ABNORMAL HIGH (ref 11.5–15.5)
WBC: 9.3 10*3/uL (ref 4.0–10.5)
nRBC: 0 % (ref 0.0–0.2)

## 2018-08-23 LAB — I-STAT BETA HCG BLOOD, ED (MC, WL, AP ONLY): I-stat hCG, quantitative: <5 m[IU]/mL (ref <5)

## 2018-08-23 MED ORDER — LIDOCAINE-EPINEPHRINE (PF) 2 %-1:200000 IJ SOLN
20.0000 mL | Freq: Once | INTRAMUSCULAR | Status: AC
  Administered 2018-08-23: 20 mL via INTRADERMAL
  Filled 2018-08-23: qty 20

## 2018-08-23 NOTE — ED Triage Notes (Signed)
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

## 2018-08-23 NOTE — ED Notes (Signed)
Patient verbalizes understanding of discharge instructions. Opportunity for questioning and answering were provided.  patient discharged from ED.  

## 2018-08-23 NOTE — Discharge Instructions (Signed)
You can take Tylenol or ibuprofen for pain Keep wound clean with warm soap and water and keep bandage dry, do not submerge in water for 24 hours. Change bandage sooner if it gets dirty Return for fever, increased redness, swelling, pain, or worsening drainage Continue Clindamycin Return to the ED for a wound re-check in 2-3 days

## 2018-08-23 NOTE — ED Provider Notes (Signed)
[facility] Provider Note   CSN: [id number] Arrival date & time: 08/23/18  1152     History   Chief Complaint Chief Complaint  Patient presents with  . Wound Infection    HPI Leah Whitaker is a 28 y.o. female who presents with multiple abscesses on the right leg.  Past medical history significant for polysubstance abuse (heroin, cocaine), recurrent leg abscesses requiring surgical drainage.  The patient states that she first started to notice redness, swelling, pain in the right ankle and right lower leg 4 days ago.  She thinks it may be from poison ivy which she got a couple weeks ago and has been scratching a lot. She went to urgent care 2 days ago and underwent incision and drainage over the abscesses on her ankle and lower leg.  The wounds were packed and she was given clindamycin.  She has been taking this medicine for the past 2 days.  Today she noticed redness and swelling behind her knee.  She states there was previously scar tissue in this area from prior abscesses.  She went back to urgent care however the provider felt like the wounds were not improving enough and therefore she needed to come to the emergency department.  Patient states she has been clean since going to rehab for several months.  She denies fever, chest pain, shortness of breath, cough, abdominal pain, nausea or vomiting. Previous wound cultures grew out MRSA.     HPI  Past Medical History:  Diagnosis Date  . Anxiety   . Asthma    last inhaler use "years - as a child  . Depression   . GERD (gastroesophageal reflux disease)     Patient Active Problem List   Diagnosis Date Noted  . Abscess of left lower leg 10/26/2017  . Tibia/fibula fracture, left, open type I or II, initial encounter 05/05/2017  . Indication for care in labor or delivery 11/28/2014  . Vaginal delivery 11/28/2014    Past Surgical History:  Procedure Laterality Date  . INCISION AND  DRAINAGE ABSCESS Left 10/27/2017   Procedure: INCISION AND DRAINAGE ABSCESS;  Surgeon: Roby LoftsHaddix, Kevin P, MD;  Location: [facility] OR;  Service: Orthopedics;  Laterality: Left;  . TIBIA IM NAIL INSERTION Left 05/05/2017   Procedure: LEFT INTRAMEDULLARY (IM) TIBIAL NAIL;  Surgeon: Roby LoftsHaddix, Kevin P, MD;  Location: [facility] OR;  Service: Orthopedics;  Laterality: Left;  . WISDOM TOOTH EXTRACTION       OB History    Gravida  1   Para  1   Term  1   Preterm      AB      Living  1     SAB      TAB      Ectopic      Multiple  0   Live Births  1            Home Medications    Prior to Admission medications   Medication Sig Start Date End Date Taking? Authorizing Provider  BLISOVI 24 FE 1-20 MG-MCG(24) tablet Take 1 tablet by mouth daily. 03/27/17   [provider]  ciprofloxacin (CIPRO) 500 MG tablet Take 1 tablet (500 mg total) by mouth 2 (two) times daily. 10/28/17   Guy Sandiferobbins, Colby Alan, PA  doxycycline (VIBRA-TABS) 100 MG tablet Take 1 tablet (100 mg total) by mouth 2 (two) times daily. 10/28/17   Guy Sandiferobbins, Colby Alan, PA  omeprazole (PRILOSEC) 20 MG capsule Take 20  mg by mouth daily as needed (heartburn).  03/25/17   [provider]  rOPINIRole (REQUIP) 1 MG tablet Take 1 mg by mouth at bedtime as needed (sleep).     [provider]  traZODone (DESYREL) 100 MG tablet Take 200 mg by mouth at bedtime as needed for sleep.  04/18/17   [provider]    Family History No family history on file.  Social History Social History   Tobacco Use  . Smoking status: Current Every Day Smoker    Packs/day: 1.00    Years: 10.00    Pack years: 10.00  . Smokeless tobacco: Current User    Types: Snuff  . Tobacco comment: rare snuff- 2 times ayear   Substance Use Topics  . Alcohol use: No  . Drug use: Not Currently    Types: IV, Cocaine    Comment: 10/26/2017- in recovery- over a year     Allergies   Seroquel [quetiapine]   Review of Systems Review of  Systems  Constitutional: Negative for fever.  Respiratory: Negative for shortness of breath.   Cardiovascular: Negative for chest pain.  Gastrointestinal: Negative for abdominal pain.  Skin: Positive for color change and wound.       +abscess  All other systems reviewed and are negative.    Physical Exam Updated Vital Signs BP 122/80   Pulse 96   Temp 99.3 F (37.4 C) (Oral)   Resp 18   SpO2 98%   Physical Exam Vitals signs and nursing note reviewed.  Constitutional:      General: She is not in acute distress.    Appearance: Normal appearance. She is well-developed. She is not ill-appearing.     Comments: Calm and cooperative  HENT:     Head: Normocephalic and atraumatic.  Eyes:     General: No scleral icterus.       Right eye: No discharge.        Left eye: No discharge.     Conjunctiva/sclera: Conjunctivae normal.     Pupils: Pupils are equal, round, and reactive to light.  Neck:     Musculoskeletal: Normal range of motion.  Cardiovascular:     Rate and Rhythm: Normal rate.  Pulmonary:     Effort: Pulmonary effort is normal. No respiratory distress.  Abdominal:     General: There is no distension.  Musculoskeletal:     Comments: Right lower leg: Diffuse swelling and patchy erythema from the foot to the knee. There is an abscess over the medial ankle which is packed. There is another abscess over the mid-shin which is packed. There is a fluctuant, tender, abscess with surrounding erythema behind the right knee.   Skin:    General: Skin is warm and dry.     Findings: Lesion (Multiple excoriations lesions on the hands, lower legs, face consistent with skin picking) present.  Neurological:     Mental Status: She is alert and oriented to person, place, and time.  Psychiatric:        Behavior: Behavior normal.              ED Treatments / Results  Labs (all labs ordered are listed, but only abnormal results are displayed) Labs Reviewed  CBC WITH  DIFFERENTIAL/PLATELET - Abnormal; Notable for the following components:      Result Value   Hemoglobin 11.3 (*)    MCH 24.4 (*)    RDW 18.1 (*)    Monocytes Absolute 1.3 (*)  All other components within normal limits  I-STAT BETA HCG BLOOD, ED ([facility], [facility], [facility] ONLY)    EKG    Radiology Dg Ankle Complete Right  Result Date: 08/23/2018 CLINICAL DATA:  Ankle pain. EXAM: RIGHT ANKLE - COMPLETE 3+ VIEW COMPARISON:  None. FINDINGS: There is no acute displaced fracture or dislocation. There is soft tissue swelling about the ankle, most notably about the medial malleolus. There is no radiopaque foreign body. There is no radiographic evidence for osteomyelitis. IMPRESSION: 1. No acute displaced fracture or dislocation. 2. Nonspecific soft tissue swelling about the ankle, most notably about the medial malleolus. There may be an ulceration at the level of the medial malleolus. No underlying radiopaque foreign body or radiographic evidence of osteomyelitis. Electronically Signed   By: Katherine Mantlehristopher  Green M.D.   On: 08/23/2018 13:27    Procedures .Marland Kitchen.Incision and Drainage  Date/Time: 08/23/2018 3:37 PM Performed by: Bethel BornGekas, Arden Tinoco Marie, PA-C Authorized by: Bethel BornGekas, Calista Crain Marie, PA-C   Consent:    Consent obtained:  Verbal   Consent given by:  Patient   Risks discussed:  Bleeding, incomplete drainage, pain and damage to other organs   Alternatives discussed:  No treatment Universal protocol:    Procedure explained and questions answered to patient or proxy's satisfaction: yes     Relevant documents present and verified: yes     Test results available and properly labeled: yes     Imaging studies available: yes     Required blood products, implants, devices, and special equipment available: yes     Site/side marked: yes     Immediately prior to procedure a time out was called: yes     Patient identity confirmed:  Verbally with patient Location:    Type:  Abscess   Size:  3x3   Location:  Lower  extremity   Lower extremity location:  Knee   Knee location:  R knee (medial and posterior) Pre-procedure details:    Skin preparation:  Betadine Anesthesia (see MAR for exact dosages):    Anesthesia method:  Local infiltration   Local anesthetic:  Lidocaine 2% WITH epi Procedure type:    Complexity:  Simple Procedure details:    Needle aspiration: no     Incision types:  Single straight   Incision depth:  Subcutaneous   Scalpel blade:  11   Wound management:  Probed and deloculated, irrigated with saline and extensive cleaning   Drainage:  Purulent   Drainage amount:  Copious   Packing materials:  None Post-procedure details:    Patient tolerance of procedure:  Tolerated with difficulty   (including critical care time)    Medications Ordered in ED Medications  lidocaine-EPINEPHrine (XYLOCAINE W/EPI) 2 %-1:200000 (PF) injection 20 mL (20 mLs Intradermal Given by Other 08/23/18 1410)     Initial Impression / Assessment and Plan / ED Course  I have reviewed the triage vital signs and the nursing notes.  Pertinent labs & imaging results that were available during my care of the patient were reviewed by me and considered in my medical decision making (see chart for details).  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  She is already had a couple abscesses on her lower leg drained and now she broke up with another 1 on her medial knee.  She is nontoxic-appearing.  There is definitely some surrounding erythema and may benefit from  IV antibiotics but the patient would rather go home which I do not think is unreasonable currently.  We will bring her back for another wound check and see how she is doing after she is been on antibiotics for little while.   [MB]    Clinical Course User Index [MB] Hayden Rasmussen, MD   28 year old female with history of IV drug abuse and  recurrent leg abscesses presents with a new leg abscess behind the right knee and redness and swelling of the right leg.  Her vital signs are normal here and she is well-appearing.  She is uncomfortable due to pain.  She had 2 abscesses that were drained and packed at urgent care.  Packing was removed and wound bed looks like it is healing.  The abscess over the right ankle looks somewhat necrotic appearing.  No sign of septic ankle or knee joint. Will obtain basic labs and perform incision and drainage.  I&D was performed on the abscess behind the right knee.  Patient did not tolerate the procedure very well but allowed it to be done.  Immediately after she felt much better.  Copious amount of purulent drainage was expressed.  The wounds were redressed.  Shared visit with Dr. Melina Copa.  X-ray of the right ankle is negative for any bony abnormality.At this point the patient prefers to go home.  She was offered admission but declined and would rather continue her antibiotics and come back for a wound check. She has no leukocytosis. BMP clotted but since she wants to go home, will not repeat.  She is advised to come back in the next 2 to 3 days.  She verbalized understanding.   Final Clinical Impressions(s) / ED Diagnoses   Final diagnoses:  Cellulitis and abscess of right leg    ED Discharge Orders    None       Recardo Evangelist, PA-C 08/23/18 1539    Hayden Rasmussen, MD 08/24/18 1048

## 2018-10-07 ENCOUNTER — Encounter (HOSPITAL_BASED_OUTPATIENT_CLINIC_OR_DEPARTMENT_OTHER): Payer: Self-pay | Admitting: Emergency Medicine

## 2018-10-07 ENCOUNTER — Other Ambulatory Visit: Payer: Self-pay

## 2018-10-07 ENCOUNTER — Emergency Department (HOSPITAL_BASED_OUTPATIENT_CLINIC_OR_DEPARTMENT_OTHER)
Admission: EM | Admit: 2018-10-07 | Discharge: 2018-10-07 | Disposition: A | Discharge status: Home / Self Care | Payer: Self-pay | Attending: Emergency Medicine | Admitting: Emergency Medicine

## 2018-10-07 DIAGNOSIS — Z79899 Other long term (current) drug therapy: Secondary | ICD-10-CM | POA: Insufficient documentation

## 2018-10-07 DIAGNOSIS — F17200 Nicotine dependence, unspecified, uncomplicated: Secondary | ICD-10-CM | POA: Insufficient documentation

## 2018-10-07 DIAGNOSIS — L0291 Cutaneous abscess, unspecified: Secondary | ICD-10-CM

## 2018-10-07 DIAGNOSIS — L02415 Cutaneous abscess of right lower limb: Secondary | ICD-10-CM | POA: Insufficient documentation

## 2018-10-07 DIAGNOSIS — J45909 Unspecified asthma, uncomplicated: Secondary | ICD-10-CM | POA: Insufficient documentation

## 2018-10-07 MED ORDER — CLINDAMYCIN HCL 150 MG PO CAPS: 450.0000 mg | ORAL_CAPSULE | Freq: Three times a day (TID) | ORAL | 0 refills | Status: DC

## 2018-10-07 MED ORDER — PROBIOTIC 250 MG PO CAPS: 1.0000 | ORAL_CAPSULE | Freq: Every day | ORAL | 0 refills | Status: DC

## 2018-10-07 MED ORDER — CHLORHEXIDINE GLUCONATE 4 % EX LIQD: CUTANEOUS | 0 refills | Status: DC

## 2018-10-07 NOTE — Discharge Instructions (Addendum)
Take clindamycin with food as prescribed until completed.  Take this with a probiotic or eating yogurt daily while taking this medication.  Wash once weekly with Hibiclens to help decrease the colonization of MRSA on your skin.  Please return to the emergency department if you develop increasing pain, redness, swelling of the area on your leg, or fever over 100.4.

## 2018-10-07 NOTE — ED Triage Notes (Signed)
Abscess to R leg.

## 2018-10-08 NOTE — ED Provider Notes (Signed)
[facility] EMERGENCY DEPARTMENT Provider Note   CSN: [id number] Arrival date & time: 10/07/18  1834     History   Chief Complaint Chief Complaint  Patient presents with  . Abscess    HPI Leah Whitaker is a 28 y.o. female with history of abscess and MRSA who presents with abscess to right medial knee.  Patient reports that she noticed it a couple days ago and has become hardened tender.  It has not become very red yet.  She reports she wants to come early so it did not become a large abscess that needed drained.  She denies any fever or other associated symptoms.  No interventions taken prior to arrival.  Patient reports having several abscesses in the past.  She is 7 months clean from IV drug use.     HPI  Past Medical History:  Diagnosis Date  . Anxiety   . Asthma    last inhaler use "years - as a child  . Depression   . GERD (gastroesophageal reflux disease)     Patient Active Problem List   Diagnosis Date Noted  . Abscess of left lower leg 10/26/2017  . Tibia/fibula fracture, left, open type I or II, initial encounter 05/05/2017  . Indication for care in labor or delivery 11/28/2014  . Vaginal delivery 11/28/2014    Past Surgical History:  Procedure Laterality Date  . INCISION AND DRAINAGE ABSCESS Left 10/27/2017   Procedure: INCISION AND DRAINAGE ABSCESS;  Surgeon: Roby LoftsHaddix, Kevin P, MD;  Location: [facility] OR;  Service: Orthopedics;  Laterality: Left;  . TIBIA IM NAIL INSERTION Left 05/05/2017   Procedure: LEFT INTRAMEDULLARY (IM) TIBIAL NAIL;  Surgeon: Roby LoftsHaddix, Kevin P, MD;  Location: [facility] OR;  Service: Orthopedics;  Laterality: Left;  . WISDOM TOOTH EXTRACTION       OB History    Gravida  1   Para  1   Term  1   Preterm      AB      Living  1     SAB      TAB      Ectopic      Multiple  0   Live Births  1            Home Medications    Prior to Admission medications   Medication Sig Start Date End Date Taking? Authorizing  Provider  BLISOVI 24 FE 1-20 MG-MCG(24) tablet Take 1 tablet by mouth daily. 03/27/17   [provider]  chlorhexidine (HIBICLENS) 4 % external liquid Apply topically once a week. 10/07/18   Saunders [place], Waylan BogaAlexandra M, PA-C  clindamycin (CLEOCIN) 150 MG capsule Take 3 capsules (450 mg total) by mouth 3 (three) times daily. 10/07/18   Hiawatha Merriott, Waylan BogaAlexandra M, PA-C  omeprazole (PRILOSEC) 20 MG capsule Take 20 mg by mouth daily as needed (heartburn).  03/25/17   [provider]  rOPINIRole (REQUIP) 1 MG tablet Take 1 mg by mouth at bedtime as needed (sleep).     [provider]  Saccharomyces boulardii (PROBIOTIC) 250 MG CAPS Take 1 capsule by mouth daily. 10/07/18   Braniya Farrugia, Waylan BogaAlexandra M, PA-C  traZODone (DESYREL) 100 MG tablet Take 200 mg by mouth at bedtime as needed for sleep.  04/18/17   [provider]    Family History No family history on file.  Social History Social History   Tobacco Use  . Smoking status: Current Every Day Smoker    Packs/day: 1.00    Years: 10.00  Pack years: 10.00  . Smokeless tobacco: Current User    Types: Snuff  . Tobacco comment: rare snuff- 2 times ayear   Substance Use Topics  . Alcohol use: No  . Drug use: Not Currently    Types: IV, Cocaine    Comment: 10/26/2017- in recovery- over a year     Allergies   Seroquel [quetiapine]   Review of Systems Review of Systems  Constitutional: Negative for fever.  Skin: Positive for wound.     Physical Exam Updated Vital Signs BP 112/70 (BP Location: Right Arm)   Pulse 88   Temp 98.7 F (37.1 C)   Resp 18   Ht 5\' 8"  (1.727 m)   Wt 66 kg   LMP 09/22/2018   SpO2 100%   BMI 22.12 kg/m   Physical Exam Vitals signs and nursing note reviewed.  Constitutional:      General: She is not in acute distress.    Appearance: She is well-developed. She is not diaphoretic.  HENT:     Head: Normocephalic and atraumatic.     Mouth/Throat:     Pharynx: No oropharyngeal exudate.  Eyes:      General: No scleral icterus.       Right eye: No discharge.        Left eye: No discharge.     Conjunctiva/sclera: Conjunctivae normal.     Pupils: Pupils are equal, round, and reactive to light.  Neck:     Musculoskeletal: Normal range of motion and neck supple.     Thyroid: No thyromegaly.  Cardiovascular:     Rate and Rhythm: Normal rate and regular rhythm.     Heart sounds: Normal heart sounds. No murmur. No friction rub. No gallop.   Pulmonary:     Effort: Pulmonary effort is normal. No respiratory distress.     Breath sounds: Normal breath sounds. No stridor. No wheezing or rales.  Abdominal:     General: Bowel sounds are normal. There is no distension.     Palpations: Abdomen is soft.     Tenderness: There is no abdominal tenderness. There is no guarding or rebound.  Musculoskeletal:       Legs:  Lymphadenopathy:     Cervical: No cervical adenopathy.  Skin:    General: Skin is warm and dry.     Coloration: Skin is not pale.     Findings: No rash.  Neurological:     Mental Status: She is alert.     Coordination: Coordination normal.      ED Treatments / Results  Labs (all labs ordered are listed, but only abnormal results are displayed) Labs Reviewed - No data to display  EKG None  Radiology No results found.  Procedures Procedures (including critical care time)  Medications Ordered in ED Medications - No data to display   Initial Impression / Assessment and Plan / ED Course  I have reviewed the triage vital signs and the nursing notes.  Pertinent labs & imaging results that were available during my care of the patient were reviewed by me and considered in my medical decision making (see chart for details).       Patient presenting with suspected early abscess.  I suggested a bedside ultrasound, however patient declines and states she wants to keep her cost as low as possible.  I feel this is reasonable considering the area is small and very low  suspicion of needing I&D at this time.  Will start clindamycin and advised  return if she is developing increasing pain, redness, swelling, or fluctuance.  Advised probiotic with clindamycin.  I also advised Hibiclens considering patient's recurrent abscesses.  Patient understands and agrees with plan.  Patient vitals stable throughout ED course and discharged in satisfactory condition.  Final Clinical Impressions(s) / ED Diagnoses   Final diagnoses:  Abscess    ED Discharge Orders         Ordered    clindamycin (CLEOCIN) 150 MG capsule  3 times daily     10/07/18 2003    Saccharomyces boulardii (PROBIOTIC) 250 MG CAPS  Daily     10/07/18 2003    chlorhexidine (HIBICLENS) 4 % external liquid  Weekly     10/07/18 [id number]           Jaloni Davoli M, [place] JerseyPA-C 10/08/18 1208    Virgina NorfolkCuratolo, Adam, DO 10/10/18 1122

## 2019-02-14 ENCOUNTER — Encounter (HOSPITAL_BASED_OUTPATIENT_CLINIC_OR_DEPARTMENT_OTHER): Payer: Self-pay

## 2019-02-14 ENCOUNTER — Other Ambulatory Visit: Payer: Self-pay

## 2019-02-14 ENCOUNTER — Emergency Department (HOSPITAL_BASED_OUTPATIENT_CLINIC_OR_DEPARTMENT_OTHER)
Admission: EM | Admit: 2019-02-14 | Discharge: 2019-02-14 | Disposition: A | Discharge status: Home / Self Care | Payer: Self-pay | Attending: Emergency Medicine | Admitting: Emergency Medicine

## 2019-02-14 DIAGNOSIS — Z5321 Procedure and treatment not carried out due to patient leaving prior to being seen by health care provider: Secondary | ICD-10-CM | POA: Insufficient documentation

## 2019-02-14 DIAGNOSIS — L0291 Cutaneous abscess, unspecified: Secondary | ICD-10-CM | POA: Insufficient documentation

## 2019-02-14 NOTE — ED Triage Notes (Signed)
Abscess x4 days, antibiotic started yesterday but noticed increased swelling today. Denies injury. Hx of abscesses.

## 2019-02-15 ENCOUNTER — Other Ambulatory Visit: Payer: Self-pay

## 2019-02-15 ENCOUNTER — Emergency Department (HOSPITAL_BASED_OUTPATIENT_CLINIC_OR_DEPARTMENT_OTHER)
Admission: EM | Admit: 2019-02-15 | Discharge: 2019-02-15 | Disposition: A | Discharge status: Home / Self Care | Payer: Self-pay | Attending: Emergency Medicine | Admitting: Emergency Medicine

## 2019-02-15 ENCOUNTER — Encounter (HOSPITAL_BASED_OUTPATIENT_CLINIC_OR_DEPARTMENT_OTHER): Payer: Self-pay | Admitting: *Deleted

## 2019-02-15 DIAGNOSIS — L02512 Cutaneous abscess of left hand: Secondary | ICD-10-CM | POA: Insufficient documentation

## 2019-02-15 DIAGNOSIS — F17210 Nicotine dependence, cigarettes, uncomplicated: Secondary | ICD-10-CM | POA: Insufficient documentation

## 2019-02-15 DIAGNOSIS — J45909 Unspecified asthma, uncomplicated: Secondary | ICD-10-CM | POA: Insufficient documentation

## 2019-02-15 DIAGNOSIS — L0291 Cutaneous abscess, unspecified: Secondary | ICD-10-CM

## 2019-02-15 DIAGNOSIS — L03114 Cellulitis of left upper limb: Secondary | ICD-10-CM | POA: Insufficient documentation

## 2019-02-15 MED ORDER — KETOROLAC TROMETHAMINE 60 MG/2ML IM SOLN
60.0000 mg | Freq: Once | INTRAMUSCULAR | Status: AC
  Administered 2019-02-15: 60 mg via INTRAMUSCULAR
  Filled 2019-02-15: qty 2

## 2019-02-15 MED ORDER — LIDOCAINE HCL 2 % IJ SOLN
10.0000 mL | Freq: Once | INTRAMUSCULAR | Status: AC
  Administered 2019-02-15: 200 mg via INTRADERMAL
  Filled 2019-02-15: qty 20

## 2019-02-15 MED ORDER — CLINDAMYCIN HCL 150 MG PO CAPS: 300.0000 mg | ORAL_CAPSULE | Freq: Three times a day (TID) | ORAL | 0 refills | Status: AC

## 2019-02-15 MED ORDER — LIDOCAINE-PRILOCAINE 2.5-2.5 % EX CREA
TOPICAL_CREAM | Freq: Once | CUTANEOUS | Status: DC
  Filled 2019-02-15: qty 5

## 2019-02-15 MED ORDER — LIDOCAINE 4 % EX CREA
TOPICAL_CREAM | CUTANEOUS | Status: AC
  Administered 2019-02-15: 1
  Filled 2019-02-15: qty 5

## 2019-02-15 NOTE — ED Triage Notes (Signed)
Right hand is red, swollen, hot, painful x 5 days. 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

## 2019-02-15 NOTE — ED Notes (Signed)
Patient stated that she has been cleaned from drugs for a year now.  Left hand is red and swollen.

## 2019-02-15 NOTE — ED Provider Notes (Signed)
[facility] Provider Note   CSN: [id number] Arrival date & time: 02/15/19  1737     History Chief Complaint  Patient presents with  . Recurrent Skin Infections    Leah Whitaker is a 28 y.o. female.  HPI     28yo female with history of opiate addiction now in remission per her history, history of MRSA with recurrent abscesses and cellulitis presents with concern for left hand pain and swelling. Reports scratching her hand with a can [facility] the food [facility] and developed redness and pain over the area over the last 3 days. Pain and swelling have been worsening. Area feels tight.  Has taken left over clindamycin for the last 2 days without relief.    Past Medical History:  Diagnosis Date  . Anxiety   . Asthma    last inhaler use "years - as a child  . Depression   . GERD (gastroesophageal reflux disease)     Patient Active Problem List   Diagnosis Date Noted  . Abscess of left lower leg 10/26/2017  . Tibia/fibula fracture, left, open type I or II, initial encounter 05/05/2017  . Indication for care in labor or delivery 11/28/2014  . Vaginal delivery 11/28/2014    Past Surgical History:  Procedure Laterality Date  . INCISION AND DRAINAGE ABSCESS Left 10/27/2017   Procedure: INCISION AND DRAINAGE ABSCESS;  Surgeon: Shona Needles, MD;  Location: [place];  Service: Orthopedics;  Laterality: Left;  . TIBIA IM NAIL INSERTION Left 05/05/2017   Procedure: LEFT INTRAMEDULLARY (IM) TIBIAL NAIL;  Surgeon: Shona Needles, MD;  Location: [place];  Service: Orthopedics;  Laterality: Left;  . WISDOM TOOTH EXTRACTION       OB History    Gravida  1   Para  1   Term  1   Preterm      AB      Living  1     SAB      TAB      Ectopic      Multiple  0   Live Births  1           No family history on file.  Social History   Tobacco Use  . Smoking status: Current Every Day Smoker    Packs/day: 1.00    Years: 10.00    Pack years:  10.00  . Smokeless tobacco: Current User    Types: Snuff  . Tobacco comment: rare snuff- 2 times ayear   Substance Use Topics  . Alcohol use: No  . Drug use: Not Currently    Types: IV, Cocaine    Comment: 10/26/2017- in recovery- over a year    Home Medications Prior to Admission medications   Medication Sig Start Date End Date Taking? Authorizing Provider  BLISOVI 24 FE 1-20 MG-MCG(24) tablet Take 1 tablet by mouth daily. 03/27/17   [provider]  chlorhexidine (HIBICLENS) 4 % external liquid Apply topically once a week. 10/07/18   Law, Bea Graff, PA-C  clindamycin (CLEOCIN) 150 MG capsule Take 2 capsules (300 mg total) by mouth 3 (three) times daily for 7 days. 02/15/19 02/22/19  Gareth Morgan, MD  omeprazole (PRILOSEC) 20 MG capsule Take 20 mg by mouth daily as needed (heartburn).  03/25/17   [provider]  rOPINIRole (REQUIP) 1 MG tablet Take 1 mg by mouth at bedtime as needed (sleep).     [provider]  Saccharomyces boulardii (PROBIOTIC) 250 MG CAPS Take  1 capsule by mouth daily. 10/07/18   Law, Waylan Boga, PA-C  traZODone (DESYREL) 100 MG tablet Take 200 mg by mouth at bedtime as needed for sleep.  04/18/17   [provider]    Allergies    Seroquel [quetiapine]  Review of Systems   Review of Systems  Constitutional: Negative for fever.  Respiratory: Negative for cough.   Gastrointestinal: Negative for nausea and vomiting.  Musculoskeletal: Positive for arthralgias.  Skin: Positive for color change, pallor and wound.  Neurological: Negative for syncope.    Physical Exam Updated Vital Signs BP 96/61 (BP Location: Right Arm)   Pulse 69   Temp 98.6 F (37 C) (Oral)   Resp 20   Ht 5\' 8"  (1.727 m)   Wt 63.5 kg   LMP 02/08/2019   SpO2 100%   BMI 21.29 kg/m   Physical Exam Vitals and nursing note reviewed.  Constitutional:      General: She is not in acute distress.    Appearance: She is well-developed. She is not  diaphoretic.  HENT:     Head: Normocephalic and atraumatic.  Eyes:     Conjunctiva/sclera: Conjunctivae normal.  Cardiovascular:     Rate and Rhythm: Normal rate and regular rhythm.  Pulmonary:     Effort: Pulmonary effort is normal. No respiratory distress.  Musculoskeletal:        General: No tenderness.     Cervical back: Normal range of motion.  Skin:    General: Skin is warm and dry.     Findings: Erythema (radial side of left hand on palmar side, erythem extending across dorsum of hand on dorsum) present. No rash.     Comments: Good ROM of fingers, no tenderness along flexor tendon, swelling/edema of dorsum of hand without fluctuance in this area however fluctuance over lateral/radial side of index MCP 2.5cm  Neurological:     Mental Status: She is alert and oriented to person, place, and time.           ED Results / Procedures / Treatments   Labs (all labs ordered are listed, but only abnormal results are displayed) Labs Reviewed - No data to display  EKG None  Radiology No results found.  Procedures .14/04/2020Incision and Drainage  Date/Time: 02/16/2019 1:17 PM Performed by: 14/02/2019, MD Authorized by: Alvira Monday, MD   Consent:    Consent obtained:  Verbal   Consent given by:  Patient   Risks discussed:  Bleeding, damage to other organs, incomplete drainage, infection and pain   Alternatives discussed:  No treatment Location:    Type:  Abscess   Size:  2.5   Location:  Upper extremity   Upper extremity location:  Hand   Hand location:  L hand Pre-procedure details:    Skin preparation:  Betadine Anesthesia (see MAR for exact dosages):    Anesthesia method:  Local infiltration and topical application   Local anesthetic:  Lidocaine 2% w/o epi Procedure type:    Complexity:  Simple Procedure details:    Incision types:  Single straight   Scalpel blade:  11   Wound management:  Probed and deloculated   Drainage:  Purulent and bloody    Drainage amount:  Copious   Wound treatment:  Wound left open   Packing materials:  1/4 in iodoform gauze Post-procedure details:    Patient tolerance of procedure:  Tolerated well, no immediate complications   (including critical care time)  Medications Ordered in ED Medications  ketorolac (  TORADOL) injection 60 mg (60 mg Intramuscular Given 02/15/19 1905)  lidocaine (XYLOCAINE) 2 % (with pres) injection 200 mg (200 mg Intradermal Given by Other 02/15/19 2000)  lidocaine (LMX) 4 % cream (1 application  Given 02/15/19 2001)    ED Course  I have reviewed the triage vital signs and the nursing notes.  Pertinent labs & imaging results that were available during my care of the patient were reviewed by me and considered in my medical decision making (see chart for details).    MDM Rules/Calculators/A&P       28yo female with history of opiate addiction now in remission per her history, history of MRSA with recurrent abscesses and cellulitis presents with concern for left hand pain and swelling.  No hx to suggest FB, good ROM of fingers, no sign of septic arthritis or flexor tenosynovitis. Has palpable superficial abscess on exam amenable to incision and drainage in ED with surrounding cellulitis.  Denies systemic symptoms.  Abscess drained at bedside with copious purulence, packing left in place. Recommend wound check in 2 days, return for worsening, hand surgery follow up.  Given rx for clindamycin.  Final Clinical Impression(s) / ED Diagnoses Final diagnoses:  Abscess  Abscess of left hand  Cellulitis of left upper extremity    Rx / DC Orders ED Discharge Orders         Ordered    clindamycin (CLEOCIN) 150 MG capsule  3 times daily     02/15/19 2104           Alvira MondaySchlossman, Nikolaos Maddocks, MD 02/16/19 1319

## 2020-01-17 ENCOUNTER — Other Ambulatory Visit: Payer: Self-pay

## 2020-01-17 ENCOUNTER — Emergency Department (HOSPITAL_BASED_OUTPATIENT_CLINIC_OR_DEPARTMENT_OTHER)
Admission: EM | Admit: 2020-01-17 | Discharge: 2020-01-17 | Disposition: A | Discharge status: Home / Self Care | Payer: Medicaid Other | Attending: Emergency Medicine | Admitting: Emergency Medicine

## 2020-01-17 ENCOUNTER — Encounter (HOSPITAL_BASED_OUTPATIENT_CLINIC_OR_DEPARTMENT_OTHER): Payer: Self-pay | Admitting: *Deleted

## 2020-01-17 DIAGNOSIS — F1120 Opioid dependence, uncomplicated: Secondary | ICD-10-CM | POA: Diagnosis not present

## 2020-01-17 DIAGNOSIS — Z3A00 Weeks of gestation of pregnancy not specified: Secondary | ICD-10-CM | POA: Insufficient documentation

## 2020-01-17 DIAGNOSIS — F1410 Cocaine abuse, uncomplicated: Secondary | ICD-10-CM | POA: Insufficient documentation

## 2020-01-17 DIAGNOSIS — O99322 Drug use complicating pregnancy, second trimester: Secondary | ICD-10-CM | POA: Diagnosis present

## 2020-01-17 DIAGNOSIS — Z5321 Procedure and treatment not carried out due to patient leaving prior to being seen by health care provider: Secondary | ICD-10-CM | POA: Diagnosis not present

## 2020-01-17 HISTORY — DX: Other psychoactive substance dependence, uncomplicated: F19.20

## 2020-01-17 LAB — COMPREHENSIVE METABOLIC PANEL
ALT: 12 U/L (ref 0–44)
AST: 15 U/L (ref 15–41)
Albumin: 2.7 g/dL — ABNORMAL LOW (ref 3.5–5.0)
Alkaline Phosphatase: 82 U/L (ref 38–126)
Anion gap: 10 (ref 5–15)
BUN: 15 mg/dL (ref 6–20)
CO2: 24 mmol/L (ref 22–32)
Calcium: 8.8 mg/dL — ABNORMAL LOW (ref 8.9–10.3)
Chloride: 101 mmol/L (ref 98–111)
Creatinine, Ser: 0.63 mg/dL (ref 0.44–1.00)
GFR, Estimated: >60 mL/min (ref >60)
Glucose, Bld: 105 mg/dL — ABNORMAL HIGH (ref 70–99)
Potassium: 4 mmol/L (ref 3.5–5.1)
Sodium: 135 mmol/L (ref 135–145)
Total Bilirubin: 0.1 mg/dL — ABNORMAL LOW (ref 0.3–1.2)
Total Protein: 7.3 g/dL (ref 6.5–8.1)

## 2020-01-17 LAB — CBC
HCT: 36.3 % (ref 36.0–46.0)
Hemoglobin: 11.2 g/dL — ABNORMAL LOW (ref 12.0–15.0)
MCH: 24.5 pg — ABNORMAL LOW (ref 26.0–34.0)
MCHC: 30.9 g/dL (ref 30.0–36.0)
MCV: 79.4 fL — ABNORMAL LOW (ref 80.0–100.0)
Platelets: 353 10*3/uL (ref 150–400)
RBC: 4.57 MIL/uL (ref 3.87–5.11)
RDW: 14.6 % (ref 11.5–15.5)
WBC: 10.8 10*3/uL — ABNORMAL HIGH (ref 4.0–10.5)
nRBC: 0 % (ref 0.0–0.2)

## 2020-01-17 LAB — RAPID URINE DRUG SCREEN, HOSP PERFORMED
Amphetamines: NOT DETECTED
Barbiturates: NOT DETECTED
Benzodiazepines: NOT DETECTED
Cocaine: POSITIVE — AB
Opiates: POSITIVE — AB
Tetrahydrocannabinol: NOT DETECTED

## 2020-01-17 LAB — PREGNANCY, URINE: Preg Test, Ur: POSITIVE — AB

## 2020-01-17 LAB — ETHANOL: Alcohol, Ethyl (B): <10 mg/dL (ref <10)

## 2020-01-17 NOTE — ED Triage Notes (Addendum)
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 Last used yesterday. She is 6 months pregnant with no prenatal care. She is here for a medical clearance physical so she can go the rehab. [facility] is holding a bed for her.

## 2020-11-26 ENCOUNTER — Emergency Department (HOSPITAL_COMMUNITY)
Admission: EM | Admit: 2020-11-26 | Discharge: 2020-11-27 | Disposition: A | Discharge status: Home / Self Care | Payer: Medicaid Other | Attending: Emergency Medicine | Admitting: Emergency Medicine

## 2020-11-26 ENCOUNTER — Encounter (HOSPITAL_COMMUNITY): Payer: Self-pay | Admitting: Emergency Medicine

## 2020-11-26 ENCOUNTER — Other Ambulatory Visit: Payer: Self-pay

## 2020-11-26 DIAGNOSIS — R112 Nausea with vomiting, unspecified: Secondary | ICD-10-CM

## 2020-11-26 DIAGNOSIS — J45909 Unspecified asthma, uncomplicated: Secondary | ICD-10-CM | POA: Insufficient documentation

## 2020-11-26 DIAGNOSIS — F17210 Nicotine dependence, cigarettes, uncomplicated: Secondary | ICD-10-CM | POA: Insufficient documentation

## 2020-11-26 DIAGNOSIS — R197 Diarrhea, unspecified: Secondary | ICD-10-CM | POA: Insufficient documentation

## 2020-11-26 LAB — CBC WITH DIFFERENTIAL/PLATELET
Abs Immature Granulocytes: 0.01 10*3/uL (ref 0.00–0.07)
Basophils Absolute: 0 10*3/uL (ref 0.0–0.1)
Basophils Relative: 0 %
Eosinophils Absolute: 0 10*3/uL (ref 0.0–0.5)
Eosinophils Relative: 0 %
HCT: 45.6 % (ref 36.0–46.0)
Hemoglobin: 14.5 g/dL (ref 12.0–15.0)
Immature Granulocytes: 0 %
Lymphocytes Relative: 15 %
Lymphs Abs: 0.8 10*3/uL (ref 0.7–4.0)
MCH: 27.4 pg (ref 26.0–34.0)
MCHC: 31.8 g/dL (ref 30.0–36.0)
MCV: 86.2 fL (ref 80.0–100.0)
Monocytes Absolute: 0.2 10*3/uL (ref 0.1–1.0)
Monocytes Relative: 4 %
Neutro Abs: 4.2 10*3/uL (ref 1.7–7.7)
Neutrophils Relative %: 81 %
Platelets: 246 10*3/uL (ref 150–400)
RBC: 5.29 MIL/uL — ABNORMAL HIGH (ref 3.87–5.11)
RDW: 14.6 % (ref 11.5–15.5)
WBC: 5.2 10*3/uL (ref 4.0–10.5)
nRBC: 0 % (ref 0.0–0.2)

## 2020-11-26 LAB — COMPREHENSIVE METABOLIC PANEL
ALT: 29 U/L (ref 0–44)
AST: 45 U/L — ABNORMAL HIGH (ref 15–41)
Albumin: 4.3 g/dL (ref 3.5–5.0)
Alkaline Phosphatase: 51 U/L (ref 38–126)
Anion gap: 17 — ABNORMAL HIGH (ref 5–15)
BUN: 17 mg/dL (ref 6–20)
CO2: 22 mmol/L (ref 22–32)
Calcium: 9.5 mg/dL (ref 8.9–10.3)
Chloride: 104 mmol/L (ref 98–111)
Creatinine, Ser: 0.77 mg/dL (ref 0.44–1.00)
GFR, Estimated: >60 mL/min (ref >60)
Glucose, Bld: 106 mg/dL — ABNORMAL HIGH (ref 70–99)
Potassium: 4.2 mmol/L (ref 3.5–5.1)
Sodium: 143 mmol/L (ref 135–145)
Total Bilirubin: 1.3 mg/dL — ABNORMAL HIGH (ref 0.3–1.2)
Total Protein: 8.1 g/dL (ref 6.5–8.1)

## 2020-11-26 LAB — MAGNESIUM: Magnesium: 2.3 mg/dL (ref 1.7–2.4)

## 2020-11-26 MED ORDER — ONDANSETRON HCL 4 MG/2ML IJ SOLN
4.0000 mg | Freq: Once | INTRAMUSCULAR | Status: AC
  Administered 2020-11-27: 4 mg via INTRAVENOUS
  Filled 2020-11-26: qty 2

## 2020-11-26 MED ORDER — SODIUM CHLORIDE 0.9 % IV BOLUS
1000.0000 mL | Freq: Once | INTRAVENOUS | Status: AC
  Administered 2020-11-27: 1000 mL via INTRAVENOUS

## 2020-11-26 NOTE — ED Provider Notes (Signed)
[facility]-EMERGENCY DEPT Provider Note   CSN: [id number] Arrival date & time: 11/26/20  2111     History Chief Complaint  Patient presents with   detox   Emesis    Leah Whitaker is a 30 y.o. female.  The history is provided by the patient and medical records.  Emesis Associated symptoms: diarrhea    30 y.o. F with hx of anxiety, asthma, depression, substance abuse, presenting to the ED due to detoxing from suboxone.  States she has been on this for about 9 months, last dose was yesterday afternoon.  States all day today she has had N/V/D.  She denies fever.  She has not had any nausea medication.  Denies any illicit drug or alcohol abuse today.  States she was in jail for the past 24 hours which is why she could not get her suboxone.  Past Medical History:  Diagnosis Date   Anxiety    Asthma    last inhaler use "years - as a child   Depression    Drug addiction (HCC)    GERD (gastroesophageal reflux disease)     Patient Active Problem List   Diagnosis Date Noted   Abscess of left lower leg 10/26/2017   Tibia/fibula fracture, left, open type I or II, initial encounter 05/05/2017   Indication for care in labor or delivery 11/28/2014   Vaginal delivery 11/28/2014    Past Surgical History:  Procedure Laterality Date   INCISION AND DRAINAGE ABSCESS Left 10/27/2017   Procedure: INCISION AND DRAINAGE ABSCESS;  Surgeon: Roby Lofts, MD;  Location: [facility];  Service: Orthopedics;  Laterality: Left;   TIBIA IM NAIL INSERTION Left 05/05/2017   Procedure: LEFT INTRAMEDULLARY (IM) TIBIAL NAIL;  Surgeon: Roby Lofts, MD;  Location: [facility];  Service: Orthopedics;  Laterality: Left;   WISDOM TOOTH EXTRACTION       OB History     Gravida  2   Para  1   Term  1   Preterm      AB      Living  1      SAB      IAB      Ectopic      Multiple  0   Live Births  1           History reviewed. No pertinent family history.  Social  History   Tobacco Use   Smoking status: Every Day    Packs/day: 1.00    Years: 10.00    Pack years: 10.00    Types: Cigarettes   Smokeless tobacco: Current    Types: Snuff   Tobacco comments:    rare snuff- 2 times ayear   Vaping Use   Vaping Use: Some days  Substance Use Topics   Alcohol use: No   Drug use: Not Currently    Types: Cocaine    Comment: fenanyl and crack addiction    Home Medications Prior to Admission medications   Medication Sig Start Date End Date Taking? Authorizing Provider  BLISOVI 24 FE 1-20 MG-MCG(24) tablet Take 1 tablet by mouth daily. 03/27/17   [provider]  chlorhexidine (HIBICLENS) 4 % external liquid Apply topically once a week. 10/07/18   Law, Waylan Boga, PA-C  omeprazole (PRILOSEC) 20 MG capsule Take 20 mg by mouth daily as needed (heartburn).  03/25/17   [provider]  Prenatal Vit-Fe Fumarate-FA (PRENATAL VITAMINS PO) Take by mouth.    [provider]  rOPINIRole (REQUIP) 1 MG tablet Take 1 mg by mouth at bedtime as needed (sleep).     [provider]  Saccharomyces boulardii (PROBIOTIC) 250 MG CAPS Take 1 capsule by mouth daily. 10/07/18   Law, Waylan Boga, PA-C  traZODone (DESYREL) 100 MG tablet Take 200 mg by mouth at bedtime as needed for sleep.  04/18/17   [provider]    Allergies    Seroquel [quetiapine]  Review of Systems   Review of Systems  Gastrointestinal:  Positive for diarrhea, nausea and vomiting.  All other systems reviewed and are negative.  Physical Exam Updated Vital Signs BP 140/87 (BP Location: Left Arm)   Pulse 70   Temp 98.6 F (37 C) (Oral)   Resp 20   SpO2 100%   Physical Exam Vitals and nursing note reviewed.  Constitutional:      Appearance: She is well-developed.     Comments: Actively vomiting  HENT:     Head: Normocephalic and atraumatic.     Comments: Multiple chronic appear scabs to the face and neck Eyes:     Conjunctiva/sclera: Conjunctivae  normal.     Pupils: Pupils are equal, round, and reactive to light.  Cardiovascular:     Rate and Rhythm: Normal rate and regular rhythm.     Heart sounds: Normal heart sounds.  Pulmonary:     Effort: Pulmonary effort is normal. No respiratory distress.     Breath sounds: Normal breath sounds. No rhonchi.  Abdominal:     General: Bowel sounds are normal.     Palpations: Abdomen is soft.     Tenderness: There is no abdominal tenderness. There is no rebound.  Musculoskeletal:        General: Normal range of motion.     Cervical back: Normal range of motion.  Skin:    General: Skin is warm and dry.  Neurological:     Mental Status: She is alert and oriented to person, place, and time.    ED Results / Procedures / Treatments   Labs (all labs ordered are listed, but only abnormal results are displayed) Labs Reviewed  CBC WITH DIFFERENTIAL/PLATELET - Abnormal; Notable for the following components:      Result Value   RBC 5.29 (*)    All other components within normal limits  COMPREHENSIVE METABOLIC PANEL - Abnormal; Notable for the following components:   Glucose, Bld 106 (*)    AST 45 (*)    Total Bilirubin 1.3 (*)    Anion gap 17 (*)    All other components within normal limits  ACETAMINOPHEN LEVEL - Abnormal; Notable for the following components:   Acetaminophen (Tylenol), Serum <10 (*)    All other components within normal limits  SALICYLATE LEVEL - Abnormal; Notable for the following components:   Salicylate Lvl <7.0 (*)    All other components within normal limits  ETHANOL  MAGNESIUM  URINALYSIS, ROUTINE W REFLEX MICROSCOPIC  RAPID URINE DRUG SCREEN, HOSP PERFORMED    EKG None  Radiology No results found.  Procedures Procedures   Medications Ordered in ED Medications  metoCLOPramide (REGLAN) injection 10 mg (has no administration in time range)  ondansetron (ZOFRAN) injection 4 mg (4 mg Intravenous Given 11/27/20 0146)  sodium chloride 0.9 % bolus 1,000 mL  (1,000 mLs Intravenous New Bag/Given 11/27/20 0146)    ED Course  I have reviewed the triage vital signs and the nursing notes.  Pertinent labs & imaging results that were available during my care  of the patient were reviewed by me and considered in my medical decision making (see chart for details).    MDM Rules/Calculators/A&P                           30 y.o. F here with N/V/D for the past 24 hours.  Thinks she is withdrawing from suboxone.  Has been on this for the past 9 months, missed dose today as she was incarcerated overnight.  She is actively vomiting on my assessment but abdomen soft, non-tender.  Labs are grossly reassuring, does appear mildly dehydrated.  Given IVF, zofran.  2:49 AM Still vomiting after IV zofran.  Will try reglan.  5:15 AM Patient has been sleeping for a bit, woke up and drank gingerale.  No further vomiting since zofran.  Appears stable for discharge.  Rx zofran, given information for formal detox centers if desired.  5:48 AM At time of discharge, patient vomited all over the floor.  Will give dose of ativan.  6:14 AM Feeling better after dose of ativan, calling mom for ride home.  Added script for phenergan suppositories if needed.  Can return for new concerns.  Final Clinical Impression(s) / ED Diagnoses Final diagnoses:  Non-intractable vomiting with nausea, unspecified vomiting type    Rx / DC Orders ED Discharge Orders          Ordered    ondansetron (ZOFRAN ODT) 4 MG disintegrating tablet  Every 8 hours PRN        11/27/20 0537             Garlon Hatchet, PA-C 11/27/20 8416    Koleen Distance, MD 11/27/20 1511

## 2020-11-26 NOTE — ED Triage Notes (Signed)
Patient presents post release from jail and is detoxing off suboxone. She would like help with detox.    EMS vitals: 90 HR  98% SPO2 on room air

## 2020-11-26 NOTE — ED Notes (Signed)
Patient states she has been vomiting for 2 days.

## 2020-11-27 LAB — ETHANOL: Alcohol, Ethyl (B): <10 mg/dL (ref <10)

## 2020-11-27 LAB — ACETAMINOPHEN LEVEL: Acetaminophen (Tylenol), Serum: <10 ug/mL — ABNORMAL LOW (ref 10–30)

## 2020-11-27 LAB — SALICYLATE LEVEL: Salicylate Lvl: <7 mg/dL — ABNORMAL LOW (ref 7.0–30.0)

## 2020-11-27 MED ORDER — METOCLOPRAMIDE HCL 5 MG/ML IJ SOLN
10.0000 mg | INTRAMUSCULAR | Status: AC
  Administered 2020-11-27: 10 mg via INTRAVENOUS
  Filled 2020-11-27: qty 2

## 2020-11-27 MED ORDER — LORAZEPAM 2 MG/ML IJ SOLN
1.0000 mg | Freq: Once | INTRAMUSCULAR | Status: AC
  Administered 2020-11-27: 1 mg via INTRAVENOUS
  Filled 2020-11-27: qty 1

## 2020-11-27 MED ORDER — ONDANSETRON 4 MG PO TBDP: 4.0000 mg | ORAL_TABLET | Freq: Three times a day (TID) | ORAL | 0 refills | Status: DC | PRN

## 2020-11-27 MED ORDER — PROMETHAZINE HCL 25 MG RE SUPP: 25.0000 mg | Freq: Four times a day (QID) | RECTAL | 0 refills | Status: AC | PRN

## 2020-11-27 NOTE — ED Notes (Signed)
Attempted to obtain urine sample. Pt refused to get up, states she'll "get nauseous".

## 2020-11-27 NOTE — ED Notes (Signed)
Re-attempted to obtain urine sample. Pt refused to get up, states she's "still throwing up". This Clinical research associate offered to place a purewick. Pt states she "doesn't have the urge to pee" bc she's "too dehydrated". RN made aware.

## 2020-11-27 NOTE — ED Notes (Signed)
Patient still quite sleepy, attempting to call mother again for ride.

## 2020-11-27 NOTE — ED Notes (Signed)
Pt started vomiting again at discharge, ativan order received and pt cleaned up Pt is trying to call her mom to come get her, pt is resting in room until then

## 2020-11-27 NOTE — Discharge Instructions (Signed)
Take the prescribed medication as directed.  Make sure to stay hydrated best you can. If you want detox, can follow-up with one of the centers attached on back. Return to the ED for new or worsening symptoms.

## 2020-11-27 NOTE — ED Notes (Signed)
Helped pt to get dressed, IV removed. Mother on her way from [place].

## 2021-01-19 IMAGING — DX RIGHT ANKLE - COMPLETE 3+ VIEW
3 series · 3 of 3 positions shown · non-contrast
Comparison: None.

CLINICAL DATA: Ankle pain.

EXAM:
RIGHT ANKLE - COMPLETE 3+ VIEW

[x ankle ap right]
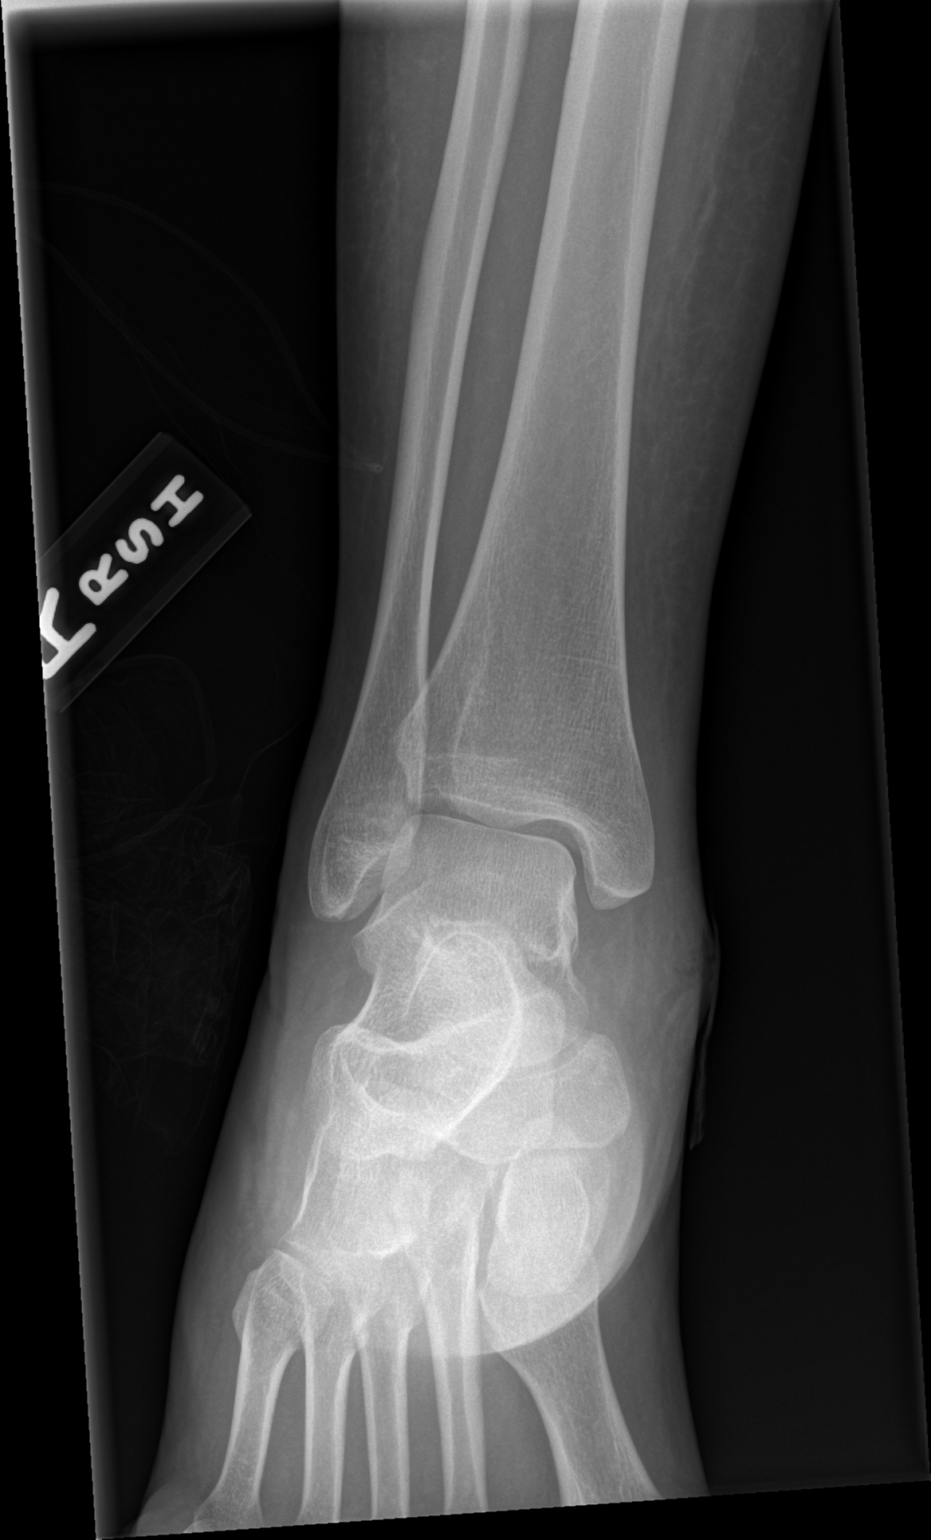

[x ankle obl right]
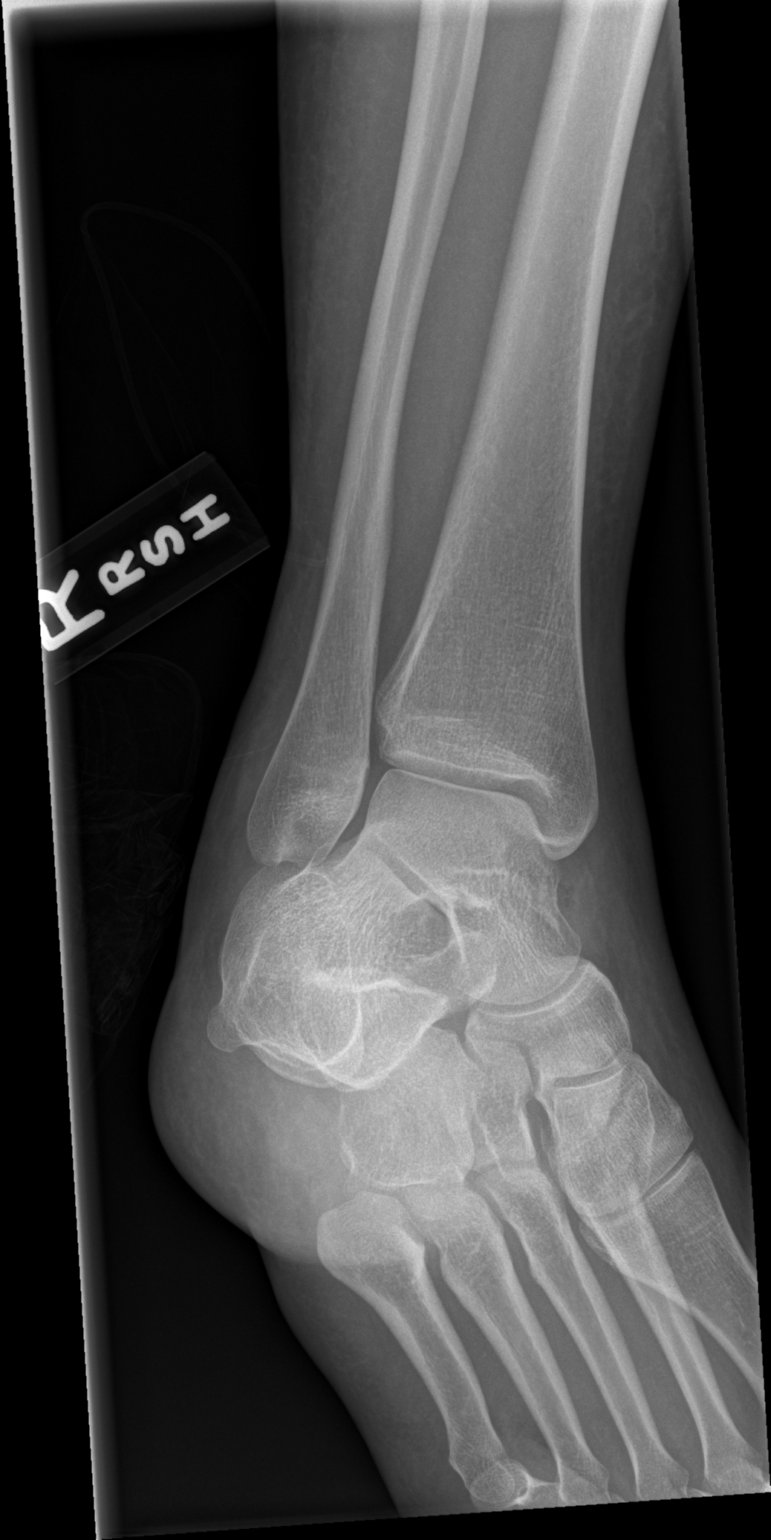

[x ankle lat right]
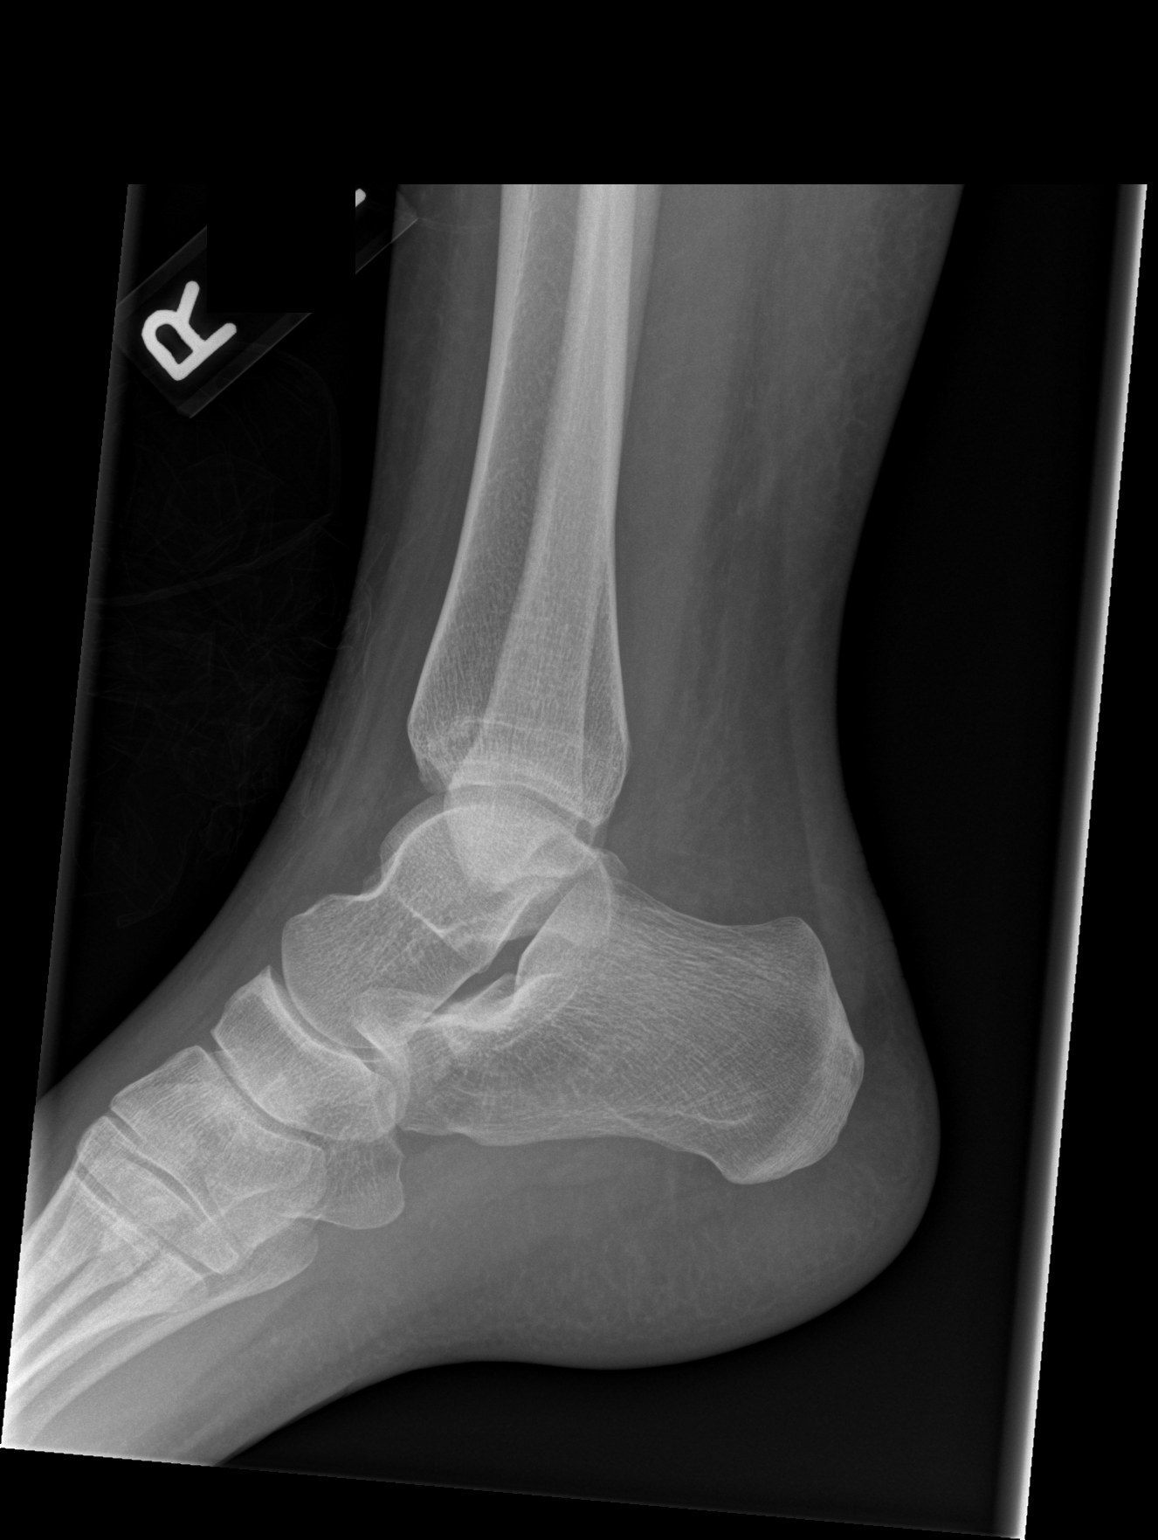

[3 of 3 positions shown; findings below may reference images not displayed]

FINDINGS: There is no acute displaced fracture or dislocation. There is soft
tissue swelling about the ankle, most notably about the medial
malleolus. There is no radiopaque foreign body. There is no
radiographic evidence for osteomyelitis.
IMPRESSION: 1. No acute displaced fracture or dislocation.
2. Nonspecific soft tissue swelling about the ankle, most notably
about the medial malleolus. There may be an ulceration at the level
of the medial malleolus. No underlying radiopaque foreign body or
radiographic evidence of osteomyelitis.

## 2023-11-09 DIAGNOSIS — Z3480 Encounter for supervision of other normal pregnancy, unspecified trimester: Secondary | ICD-10-CM | POA: Insufficient documentation

## 2023-11-10 ENCOUNTER — Other Ambulatory Visit (HOSPITAL_COMMUNITY)
Admission: RE | Admit: 2023-11-10 | Discharge: 2023-11-10 | Disposition: A | Discharge status: Home / Self Care | Source: Ambulatory Visit | Attending: Obstetrics and Gynecology | Admitting: Obstetrics and Gynecology

## 2023-11-10 ENCOUNTER — Ambulatory Visit (INDEPENDENT_AMBULATORY_CARE_PROVIDER_SITE_OTHER): Admitting: Obstetrics and Gynecology

## 2023-11-10 ENCOUNTER — Encounter: Payer: Self-pay | Admitting: Obstetrics and Gynecology

## 2023-11-10 VITALS — BP 103/70 | HR 71 | Wt 151.0 lb

## 2023-11-10 DIAGNOSIS — Z3480 Encounter for supervision of other normal pregnancy, unspecified trimester: Secondary | ICD-10-CM | POA: Diagnosis not present

## 2023-11-10 DIAGNOSIS — Z3482 Encounter for supervision of other normal pregnancy, second trimester: Secondary | ICD-10-CM | POA: Insufficient documentation

## 2023-11-10 DIAGNOSIS — Z3A19 19 weeks gestation of pregnancy: Secondary | ICD-10-CM | POA: Insufficient documentation

## 2023-11-10 DIAGNOSIS — Z1332 Encounter for screening for maternal depression: Secondary | ICD-10-CM

## 2023-11-10 NOTE — Progress Notes (Signed)
 NOB, unsure of LMP.

## 2023-11-10 NOTE — Progress Notes (Signed)
 INITIAL PRENATAL VISIT NOTE  Subjective:  Leah Whitaker is a 33 y.o. [id number] at [redacted]w[redacted]d by approximate LMP being seen today for her initial prenatal visit.  She has an obstetric history significant for SVD x 2. She has a medical history significant for substance use including fentanyl  and crack cocaine.  Patient reports no complaints.  Contractions: Not present. Vag. Bleeding: None.  Movement: Present. Denies leaking of fluid.    Past Medical History:  Diagnosis Date   Anxiety    Asthma    last inhaler use years - as a child   Depression    Drug addiction (HCC)    GERD (gastroesophageal reflux disease)    Preterm labor     Past Surgical History:  Procedure Laterality Date   FACTURE SURGERY     INCISION AND DRAINAGE ABSCESS Left 10/27/2017   Procedure: INCISION AND DRAINAGE ABSCESS;  Surgeon: Kendal Franky SQUIBB, MD;  Location: [facility] OR;  Service: Orthopedics;  Laterality: Left;   TIBIA IM NAIL INSERTION Left 05/05/2017   Procedure: LEFT INTRAMEDULLARY (IM) TIBIAL NAIL;  Surgeon: Kendal Franky SQUIBB, MD;  Location: [facility] OR;  Service: Orthopedics;  Laterality: Left;   WISDOM TOOTH EXTRACTION      OB History  Gravida Para Term Preterm AB Living  4 1 1  1 2   SAB IAB Ectopic Multiple Live Births     0 2    # Outcome Date GA Lbr Len/2nd Weight Sex Type Anes PTL Lv  4 Current           3 Gravida 02/17/20    F Vag-Spont   LIV  2 Term 11/28/14 [redacted]w[redacted]d 11:36 / 00:16 7 lb 0.3 oz (3.184 kg) M Vag-Spont EPI  LIV  1 AB             Social History   Socioeconomic History   Marital status: Divorced    Spouse name: Not on file   Number of children: 2   Years of education: Not on file   Highest education level: Not on file  Occupational History   Not on file  Tobacco Use   Smoking status: Former    Current packs/day: 1.00    Average packs/day: 1 pack/day for 10.0 years (10.0 ttl pk-yrs)    Types: Cigarettes   Smokeless tobacco: Former    Types: Snuff   Tobacco comments:    rare  snuff- 2 times ayear   Vaping Use   Vaping status: Former  Substance and Sexual Activity   Alcohol use: Not Currently   Drug use: Not Currently    Types: Cocaine    Comment: fenanyl and crack addiction   Sexual activity: Not Currently    Birth control/protection: None  Other Topics Concern   Not on file  Social History Narrative   Not on file   Social Drivers of Health   Financial Resource Strain: High Risk (02/12/2020)   Received from [facility]   Overall Financial Resource Strain (CARDIA)    Difficulty of Paying Living Expenses: Hard  Food Insecurity: Food Insecurity Present (02/17/2023)   Received from [facility]   Hunger Vital Sign    Within the past 12 months, you worried that your food would run out before you got the money to buy more.: Sometimes true    Within the past 12 months, the food you bought just didn't last and you didn't have money to get more.: Sometimes true  Transportation Needs: No Transportation Needs (02/20/2023)  Received from [facility]   PRAPARE - Transportation    Lack of Transportation (Medical): No    Lack of Transportation (Non-Medical): No  Physical Activity: Not on file  Stress: Stress Concern Present (02/17/2023)   Received from [facility] - Occupational Stress Questionnaire    Feeling of Stress : Rather much  Social Connections: Not on file    History reviewed. No pertinent family history.   Current Outpatient Medications:    promethazine  (PHENERGAN ) 25 MG suppository, Place 1 suppository (25 mg total) rectally every 6 (six) hours as needed for nausea or vomiting. (Patient not taking: Reported on 11/10/2023), Disp: 12 each, Rfl: 0   SUBOXONE 8-2 MG FILM, Place 1 Film under the tongue 2 (two) times daily. (Patient not taking: Reported on 11/10/2023), Disp: , Rfl:   Allergies  Allergen Reactions   Seroquel [Quetiapine] Other (See Comments)    BODY CONVULSIONS    Review of Systems:  Negative except for what is mentioned in HPI.  Objective:   Vitals:   11/10/23 0844  BP: 103/70  Pulse: 71  Weight: 151 lb (68.5 kg)    Fetal Status: Fetal Heart Rate (bpm): 158   Movement: Present     Physical Exam: BP 103/70   Pulse 71   Wt 151 lb (68.5 kg)   LMP 06/29/2023 (Approximate)   BMI 22.96 kg/m  CONSTITUTIONAL: Well-developed, well-nourished female in no acute distress.  NEUROLOGIC: Alert and oriented to person, place, and time. Normal reflexes, muscle tone coordination. No cranial nerve deficit noted. PSYCHIATRIC: Normal mood and affect. Normal behavior. Normal judgment and thought content. SKIN: Skin is warm and dry. No rash noted. Not diaphoretic. No erythema. No pallor. HENT:  Normocephalic, atraumatic, External right and left ear normal. Oropharynx is clear and moist EYES: Conjunctivae and EOM are normal.  NECK: Normal range of motion, supple, no masses CARDIOVASCULAR: Normal heart rate noted, regular rhythm RESPIRATORY: Effort and breath sounds normal, no problems with respiration noted BREASTS: deferred ABDOMEN: Soft, nontender, nondistended, gravid. GU: normal appearing external female genitalia, multiparous, normal appearing cervix, cervix slightly friable, scant white discharge in vagina, no lesions noted Bimanual: 19-20 weeks sized uterus, no adnexal tenderness or palpable lesions noted MUSCULOSKELETAL: Normal range of motion. EXT:  No edema and no tenderness. 2+ distal pulses.   Assessment and Plan:  Pregnancy: [id number] at [redacted]w[redacted]d by unsure LMP  1. Supervision of other normal pregnancy, antepartum (Primary) Will get anatomy/dating scan ASAP Continue routine prenatal care Due to substance use hx, will get pt to [facility].  She has used suboxone in the past, but stopped when she found out she was pregnant.  She declined suboxone at this time.  Pt denies any drug use since 09/2023 (cocaine) and is currently incarcerated, but may be released soon.   Dr.  Lola and REACH team are aware, pt is motivated regarding the program  - CBC/D/Plt+RPR+Rh+ABO+RubIgG... - Culture, OB Urine - Cytology - PAP( [place]) - Cervicovaginal ancillary only( [place]) - PANORAMA PRENATAL TEST - [place] Basic Panel - HgB A1c - US  MFM OB COMP + 14 WK; Future - AFP, Serum, Open Spina Bifida  2. [redacted] weeks gestation of pregnancy  - CBC/D/Plt+RPR+Rh+ABO+RubIgG... - Culture, OB Urine - Cytology - PAP( [place]) - Cervicovaginal ancillary only( [place]) - PANORAMA PRENATAL TEST - [place] Basic Panel - HgB A1c - US  MFM OB COMP + 14 WK; Future - AFP, Serum, Open Spina Bifida   Preterm  labor symptoms and general obstetric precautions including but not limited to vaginal bleeding, contractions, leaking of fluid and fetal movement were reviewed in detail with the patient.  Please refer to After Visit Summary for other counseling recommendations.   Return in about 4 weeks (around 12/08/2023) for ROB, in person.  Jerilynn DELENA Buddle 11/10/2023 11:13 AM

## 2023-11-12 LAB — URINE CULTURE, OB REFLEX

## 2023-11-12 LAB — CBC/D/PLT+RPR+RH+ABO+RUBIGG...
Antibody Screen: NEGATIVE
Basophils Absolute: 0 x10E3/uL (ref 0.0–0.2)
Basos: 0 %
EOS (ABSOLUTE): 0.1 x10E3/uL (ref 0.0–0.4)
Eos: 1 %
HCV Ab: REACTIVE — AB
HIV Screen 4th Generation wRfx: NONREACTIVE
Hematocrit: 39.4 % (ref 34.0–46.6)
Hemoglobin: 12.7 g/dL (ref 11.1–15.9)
Hepatitis B Surface Ag: NEGATIVE
Immature Grans (Abs): 0.1 x10E3/uL (ref 0.0–0.1)
Immature Granulocytes: 1 %
Lymphocytes Absolute: 2.1 x10E3/uL (ref 0.7–3.1)
Lymphs: 29 %
MCH: 28 pg (ref 26.6–33.0)
MCHC: 32.2 g/dL (ref 31.5–35.7)
MCV: 87 fL (ref 79–97)
Monocytes Absolute: 0.5 x10E3/uL (ref 0.1–0.9)
Monocytes: 7 %
Neutrophils Absolute: 4.5 x10E3/uL (ref 1.4–7.0)
Neutrophils: 62 %
Platelets: 250 x10E3/uL (ref 150–450)
RBC: 4.53 x10E6/uL (ref 3.77–5.28)
RDW: 14.5 % (ref 11.7–15.4)
RPR Ser Ql: NONREACTIVE
Rh Factor: POSITIVE
Rubella Antibodies, IGG: 1.75 {index} (ref >0.99)
WBC: 7.2 x10E3/uL (ref 3.4–10.8)

## 2023-11-12 LAB — AFP, SERUM, OPEN SPINA BIFIDA
AFP MoM: 1.65
AFP Value: 65.2 ng/mL
Gest. Age on Collection Date: 19 wk
Maternal Age At EDD: 31.9 a
OSBR Risk 1 IN: 552
Test Results:: NEGATIVE
Weight: 151 [lb_av]

## 2023-11-12 LAB — HCV RT-PCR, QUANT (NON-GRAPH): Hepatitis C Quantitation: NOT DETECTED [IU]/mL

## 2023-11-12 LAB — CULTURE, OB URINE

## 2023-11-12 LAB — HEMOGLOBIN A1C
Est. average glucose Bld gHb Est-mCnc: 103 mg/dL
Hgb A1c MFr Bld: 5.2 % (ref 4.8–5.6)

## 2023-11-14 ENCOUNTER — Ambulatory Visit: Payer: Self-pay | Admitting: Obstetrics and Gynecology

## 2023-11-14 DIAGNOSIS — B9689 Other specified bacterial agents as the cause of diseases classified elsewhere: Secondary | ICD-10-CM

## 2023-11-15 LAB — CERVICOVAGINAL ANCILLARY ONLY
Bacterial Vaginitis (gardnerella): POSITIVE — AB
Chlamydia: NEGATIVE
Comment: NEGATIVE
Comment: NEGATIVE
Comment: NEGATIVE
Comment: NEGATIVE
Comment: NEGATIVE
Comment: NORMAL
Neisseria Gonorrhea: NEGATIVE

## 2023-11-15 LAB — CYTOLOGY - PAP
Comment: NEGATIVE
Diagnosis: NEGATIVE
High risk HPV: POSITIVE — AB

## 2023-11-21 ENCOUNTER — Encounter: Admitting: Family Medicine

## 2023-12-07 ENCOUNTER — Telehealth: Payer: Self-pay

## 2023-12-07 ENCOUNTER — Encounter: Admitting: Obstetrics and Gynecology

## 2023-12-08 ENCOUNTER — Encounter: Admitting: Obstetrics and Gynecology

## 2023-12-18 ENCOUNTER — Ambulatory Visit

## 2023-12-18 ENCOUNTER — Other Ambulatory Visit
